# Patient Record
Sex: Female | Born: 1935 | Race: White | Hispanic: No | Smoking: Never smoker
Health system: Southern US, Community
[De-identification: ages and names within clinical notes are randomized; demographics above are authoritative.]

## PROBLEM LIST (undated history)

## (undated) DIAGNOSIS — I739 Peripheral vascular disease, unspecified: Secondary | ICD-10-CM

## (undated) DIAGNOSIS — F329 Major depressive disorder, single episode, unspecified: Secondary | ICD-10-CM

## (undated) DIAGNOSIS — K219 Gastro-esophageal reflux disease without esophagitis: Secondary | ICD-10-CM

## (undated) DIAGNOSIS — E119 Type 2 diabetes mellitus without complications: Secondary | ICD-10-CM

## (undated) DIAGNOSIS — M1990 Unspecified osteoarthritis, unspecified site: Secondary | ICD-10-CM

## (undated) DIAGNOSIS — N289 Disorder of kidney and ureter, unspecified: Secondary | ICD-10-CM

## (undated) DIAGNOSIS — G20A1 Parkinson's disease without dyskinesia, without mention of fluctuations: Secondary | ICD-10-CM

## (undated) DIAGNOSIS — I890 Lymphedema, not elsewhere classified: Secondary | ICD-10-CM

## (undated) DIAGNOSIS — N189 Chronic kidney disease, unspecified: Secondary | ICD-10-CM

## (undated) DIAGNOSIS — G20 Parkinson's disease: Secondary | ICD-10-CM

## (undated) DIAGNOSIS — R0600 Dyspnea, unspecified: Secondary | ICD-10-CM

## (undated) DIAGNOSIS — F32A Depression, unspecified: Secondary | ICD-10-CM

## (undated) HISTORY — DX: Depression, unspecified: F32.A

## (undated) HISTORY — PX: DIALYSIS FISTULA CREATION: SHX611

## (undated) HISTORY — DX: Major depressive disorder, single episode, unspecified: F32.9

## (undated) HISTORY — PX: CHOLECYSTECTOMY: SHX55

---

## 2003-10-19 ENCOUNTER — Ambulatory Visit: Payer: Self-pay | Admitting: Gastroenterology

## 2003-12-20 ENCOUNTER — Ambulatory Visit: Payer: Self-pay | Admitting: Family Medicine

## 2004-07-05 ENCOUNTER — Ambulatory Visit: Payer: Self-pay | Admitting: Family Medicine

## 2004-07-12 ENCOUNTER — Ambulatory Visit: Payer: Self-pay | Admitting: Internal Medicine

## 2004-07-14 ENCOUNTER — Ambulatory Visit: Payer: Self-pay | Admitting: Internal Medicine

## 2004-07-31 ENCOUNTER — Ambulatory Visit: Payer: Self-pay | Admitting: Internal Medicine

## 2004-08-14 ENCOUNTER — Ambulatory Visit: Payer: Self-pay | Admitting: Internal Medicine

## 2004-08-18 ENCOUNTER — Ambulatory Visit: Payer: Self-pay | Admitting: Internal Medicine

## 2004-08-21 ENCOUNTER — Ambulatory Visit (HOSPITAL_COMMUNITY): Admission: RE | Admit: 2004-08-21 | Discharge: 2004-08-21 | Payer: Self-pay | Admitting: Internal Medicine

## 2004-09-15 ENCOUNTER — Ambulatory Visit: Payer: Self-pay | Admitting: Internal Medicine

## 2004-10-15 ENCOUNTER — Ambulatory Visit: Payer: Self-pay | Admitting: Internal Medicine

## 2004-11-15 ENCOUNTER — Ambulatory Visit: Payer: Self-pay | Admitting: Internal Medicine

## 2004-12-15 ENCOUNTER — Ambulatory Visit: Payer: Self-pay | Admitting: Internal Medicine

## 2005-01-15 ENCOUNTER — Ambulatory Visit: Payer: Self-pay | Admitting: Internal Medicine

## 2005-02-15 ENCOUNTER — Ambulatory Visit: Payer: Self-pay | Admitting: Internal Medicine

## 2005-04-18 ENCOUNTER — Ambulatory Visit: Payer: Self-pay | Admitting: Family Medicine

## 2005-04-18 ENCOUNTER — Ambulatory Visit: Payer: Self-pay | Admitting: Internal Medicine

## 2005-05-15 ENCOUNTER — Ambulatory Visit: Payer: Self-pay | Admitting: Internal Medicine

## 2005-06-15 ENCOUNTER — Ambulatory Visit: Payer: Self-pay | Admitting: Internal Medicine

## 2005-07-15 ENCOUNTER — Ambulatory Visit: Payer: Self-pay | Admitting: Internal Medicine

## 2005-08-15 ENCOUNTER — Ambulatory Visit: Payer: Self-pay | Admitting: Internal Medicine

## 2005-09-15 ENCOUNTER — Ambulatory Visit: Payer: Self-pay | Admitting: Internal Medicine

## 2005-10-15 ENCOUNTER — Ambulatory Visit: Payer: Self-pay | Admitting: Internal Medicine

## 2005-11-15 ENCOUNTER — Ambulatory Visit: Payer: Self-pay | Admitting: Internal Medicine

## 2005-12-15 ENCOUNTER — Ambulatory Visit: Payer: Self-pay | Admitting: Internal Medicine

## 2005-12-19 ENCOUNTER — Ambulatory Visit: Payer: Self-pay | Admitting: Surgery

## 2005-12-19 ENCOUNTER — Other Ambulatory Visit: Payer: Self-pay

## 2006-01-01 ENCOUNTER — Ambulatory Visit: Payer: Self-pay | Admitting: Internal Medicine

## 2006-01-15 ENCOUNTER — Ambulatory Visit: Payer: Self-pay | Admitting: Internal Medicine

## 2006-02-15 ENCOUNTER — Ambulatory Visit: Payer: Self-pay | Admitting: Internal Medicine

## 2006-03-18 ENCOUNTER — Ambulatory Visit: Payer: Self-pay | Admitting: Internal Medicine

## 2006-04-16 ENCOUNTER — Ambulatory Visit: Payer: Self-pay | Admitting: Internal Medicine

## 2006-05-10 ENCOUNTER — Ambulatory Visit: Payer: Self-pay | Admitting: Family Medicine

## 2006-05-16 ENCOUNTER — Ambulatory Visit: Payer: Self-pay | Admitting: Internal Medicine

## 2006-06-16 ENCOUNTER — Ambulatory Visit: Payer: Self-pay | Admitting: Internal Medicine

## 2006-07-16 ENCOUNTER — Ambulatory Visit: Payer: Self-pay | Admitting: Internal Medicine

## 2006-08-16 ENCOUNTER — Ambulatory Visit: Payer: Self-pay | Admitting: Internal Medicine

## 2006-09-16 ENCOUNTER — Ambulatory Visit: Payer: Self-pay | Admitting: Internal Medicine

## 2006-10-16 ENCOUNTER — Ambulatory Visit: Payer: Self-pay | Admitting: Internal Medicine

## 2006-11-16 ENCOUNTER — Ambulatory Visit: Payer: Self-pay | Admitting: Internal Medicine

## 2006-12-05 ENCOUNTER — Ambulatory Visit: Payer: Self-pay | Admitting: Otolaryngology

## 2006-12-16 ENCOUNTER — Ambulatory Visit: Payer: Self-pay | Admitting: Internal Medicine

## 2006-12-19 ENCOUNTER — Ambulatory Visit: Payer: Self-pay | Admitting: Otolaryngology

## 2007-01-16 ENCOUNTER — Ambulatory Visit: Payer: Self-pay | Admitting: Internal Medicine

## 2007-02-16 ENCOUNTER — Ambulatory Visit: Payer: Self-pay | Admitting: Internal Medicine

## 2007-03-16 ENCOUNTER — Ambulatory Visit: Payer: Self-pay | Admitting: Internal Medicine

## 2007-04-07 ENCOUNTER — Ambulatory Visit: Payer: Self-pay | Admitting: Otolaryngology

## 2007-04-09 ENCOUNTER — Ambulatory Visit: Payer: Self-pay

## 2007-04-16 ENCOUNTER — Ambulatory Visit: Payer: Self-pay | Admitting: Internal Medicine

## 2007-04-17 ENCOUNTER — Ambulatory Visit: Payer: Self-pay | Admitting: Internal Medicine

## 2007-05-12 ENCOUNTER — Ambulatory Visit: Payer: Self-pay | Admitting: Family Medicine

## 2007-05-16 ENCOUNTER — Ambulatory Visit: Payer: Self-pay | Admitting: Internal Medicine

## 2007-06-16 ENCOUNTER — Ambulatory Visit: Payer: Self-pay | Admitting: Internal Medicine

## 2007-07-16 ENCOUNTER — Ambulatory Visit: Payer: Self-pay | Admitting: Internal Medicine

## 2007-08-16 ENCOUNTER — Ambulatory Visit: Payer: Self-pay | Admitting: Internal Medicine

## 2007-09-16 ENCOUNTER — Ambulatory Visit: Payer: Self-pay | Admitting: Internal Medicine

## 2007-10-16 ENCOUNTER — Ambulatory Visit: Payer: Self-pay | Admitting: Internal Medicine

## 2007-11-16 ENCOUNTER — Ambulatory Visit: Payer: Self-pay | Admitting: Internal Medicine

## 2008-05-07 ENCOUNTER — Ambulatory Visit: Payer: Self-pay | Admitting: Family Medicine

## 2008-05-17 ENCOUNTER — Ambulatory Visit: Payer: Self-pay | Admitting: Vascular Surgery

## 2008-06-14 ENCOUNTER — Ambulatory Visit: Payer: Self-pay | Admitting: Family Medicine

## 2009-08-01 ENCOUNTER — Ambulatory Visit: Payer: Self-pay | Admitting: Family Medicine

## 2010-02-15 ENCOUNTER — Ambulatory Visit: Payer: Self-pay | Admitting: Pain Medicine

## 2010-03-01 ENCOUNTER — Ambulatory Visit: Payer: Self-pay | Admitting: Pain Medicine

## 2010-03-13 ENCOUNTER — Ambulatory Visit: Payer: Self-pay | Admitting: Pain Medicine

## 2010-04-05 ENCOUNTER — Ambulatory Visit: Payer: Self-pay | Admitting: Pain Medicine

## 2010-06-07 ENCOUNTER — Encounter
Payer: Medicare Other | Attending: Physical Medicine and Rehabilitation | Admitting: Physical Medicine and Rehabilitation

## 2010-06-07 DIAGNOSIS — N19 Unspecified kidney failure: Secondary | ICD-10-CM | POA: Insufficient documentation

## 2010-06-07 DIAGNOSIS — M545 Low back pain, unspecified: Secondary | ICD-10-CM | POA: Insufficient documentation

## 2010-06-07 DIAGNOSIS — R29898 Other symptoms and signs involving the musculoskeletal system: Secondary | ICD-10-CM | POA: Insufficient documentation

## 2010-06-07 DIAGNOSIS — Z79899 Other long term (current) drug therapy: Secondary | ICD-10-CM | POA: Insufficient documentation

## 2010-06-07 DIAGNOSIS — M79609 Pain in unspecified limb: Secondary | ICD-10-CM

## 2010-06-07 DIAGNOSIS — M48061 Spinal stenosis, lumbar region without neurogenic claudication: Secondary | ICD-10-CM

## 2010-06-07 DIAGNOSIS — Z992 Dependence on renal dialysis: Secondary | ICD-10-CM | POA: Insufficient documentation

## 2010-06-07 DIAGNOSIS — E119 Type 2 diabetes mellitus without complications: Secondary | ICD-10-CM | POA: Insufficient documentation

## 2010-06-07 DIAGNOSIS — M47817 Spondylosis without myelopathy or radiculopathy, lumbosacral region: Secondary | ICD-10-CM

## 2010-06-07 DIAGNOSIS — R279 Unspecified lack of coordination: Secondary | ICD-10-CM | POA: Insufficient documentation

## 2010-06-07 DIAGNOSIS — M129 Arthropathy, unspecified: Secondary | ICD-10-CM | POA: Insufficient documentation

## 2010-06-08 NOTE — Group Therapy Note (Signed)
Renee Daugherty is referred by Dr. Austin Miles.  Renee Daugherty is an 77 year old widowed woman who is accompanied by her daughter, [name].  She was [facility] to [facility] for chronic low back pain.  Renee Daugherty' past medical history is significant for 30- year history of diabetes, currently on dialysis 3 times a week with the fistula in her left upper extremity.  She also has a history of neuropathy in her legs and feet.  Her chief complaint is low back pain, which is worse when she is up walking or standing.  She also gets a weak feeling in her lower extremities when she is up for any length of time more than 3 minutes.  Her average pain is about 8 on a 10.  Her sleep is relatively good.  Her pain interferes moderately with activity levels.  She does not drive.  She is independent with self-care.  She lives independently.  She does not use a cane or walker at this time.  She does not use any kind of assistive device.  Denies problems controlling bowel or bladder.  Denies depression, anxiety, or suicidal ideation.  She does admit to occasional nausea.  PAST MEDICAL HISTORY:  Significant for diabetes x30 years, renal failure, neuropathy, and chronic low back pain.  SOCIAL HISTORY:  The patient is widowed.  Lives alone.  Denies smoking or alcohol use.  FAMILY HISTORY:  Positive for diabetes.  Her medications she brings in today includes the following:  Gabapentin 300 mg once a day, glipizide, Nexium, pravastatin, Actos tab, vitamin D, Renvela tablet, Caltrate, __________ zolpidem and lorazepam.  Most of these medications are prescribed by Dr. Jerl Mina.  PHYSICAL EXAMINATION:  VITAL SIGNS:  Blood pressure is 112/36, pulse 90, respirations 18, 99% saturated on room air. GENERAL:  She is an obese woman who does not appear in any distress. She is oriented x3. NEUROLOGIC:  Her speech is clear.  Her affect is bright.  She is alert, cooperative, and  pleasant.  Follows commands without difficulty. Answers my questions appropriately.  Cranial nerves:  Coordinations are intact.  Her reflexes are 1 to 2+ in the upper extremities, diminished in the lower extremities.  No abnormal tone, clonus, or tremors are noted.  Motor strength is good in the upper extremities.  She has decreased strength at bilateral hip flexors and hip extensors for 4+/5 distally.  Her lower extremities have relatively good strength.  She transitions from sitting to standing slowly.  Her gait is slow and careful.  She has difficulty with tandem gait.  She has some difficulty with Romberg test.  She reports little discomfort with forward flexion of the lumbar spine, but is aggravated more with extension of the lumbar spine.  Overall, her sensation is rather good in both upper as well as lower extremities.  She has an MRI, which she brings in which was done at [facility], which was done on March 01, 2010, dictated by Dr. Elige Ko.  Report shows mild degenerative changes T12 through L3-4. She has moderate broad-based disk bulge with the right far lateral disk component abutting the right extraforaminal L3 root, moderate bilateral facet arthropathy, and moderate spinal stenosis.  Moderate bilateral foraminal stenosis, L4-5 broad-based disk bulge bilateral far lateral disk components in close proximity to the extraforaminal L4 nerve root, moderate bilateral facet arthropathy with ligamentum flavum infolding resulting in mild-to-moderate spinal stenosis, mild bilateral foraminal stenosis at L5-S1, mild broad-based disk bulge, severe bilateral  facet arthropathy, moderate left foraminal stenosis.  No significant right foraminal stenosis, no central canal stenosis.  IMPRESSION: 1. Lumbago likely related to multilevel facet arthropathy and lumbar     stenosis, predominantly L3-4, L4-5, L5-S1. 2. Mild bilateral lower extremity weakness more  proximally. 3. Mild balance disorder.  PLAN:  I have discussed a variety of treatment options with Renee Daugherty and her daughter today, which included consideration of use of a TENS unit as well as modalities or icy hot patch or topical counter irritant type patches.  We have also discussed use of various pain medications, which do have some risk of sedation and increased gait instability.  I have also discussed consideration of a TENS unit as well as some physical therapy to address balance and lower extremity strength.  They would like to start out trialing topical agents, may consider TENS unit as well.  They also seem to be interested in pursuing some physical therapy; however, transportation needs to be sorted out prior to ordering this since Ms. Gabbert does not drive.  I have answered all of her questions.  They seem comfortable with our treatment plan.  I will see her back in a month.     Brantley Stage, M.D.    DMK/MedQ D:  06/07/2010 14:10:33  T:  06/08/2010 02:38:30  Job #:  [id number]  cc:   Dr. Mike Craze, MD Fax: [phone]  Molli Barrows, MD

## 2010-07-24 ENCOUNTER — Ambulatory Visit: Payer: Medicare Other | Admitting: Physical Medicine and Rehabilitation

## 2010-09-02 ENCOUNTER — Emergency Department: Payer: Self-pay | Admitting: Emergency Medicine

## 2010-09-07 ENCOUNTER — Inpatient Hospital Stay: Payer: Self-pay | Admitting: Internal Medicine

## 2011-03-20 ENCOUNTER — Emergency Department: Payer: Self-pay | Admitting: Emergency Medicine

## 2011-04-24 ENCOUNTER — Emergency Department: Payer: Self-pay | Admitting: Emergency Medicine

## 2011-07-23 ENCOUNTER — Inpatient Hospital Stay: Payer: Self-pay | Admitting: Internal Medicine

## 2011-07-23 LAB — CBC
MCH: 33.2 pg (ref 26.0–34.0)
MCHC: 32.7 g/dL (ref 32.0–36.0)
MCV: 101 fL — ABNORMAL HIGH (ref 80–100)
Platelet: 262 10*3/uL (ref 150–440)
RBC: 3.45 10*6/uL — ABNORMAL LOW (ref 3.80–5.20)

## 2011-07-23 LAB — COMPREHENSIVE METABOLIC PANEL
Albumin: 3.8 g/dL (ref 3.4–5.0)
Anion Gap: 11 (ref 7–16)
Bilirubin,Total: 0.3 mg/dL (ref 0.2–1.0)
Calcium, Total: 9.7 mg/dL (ref 8.5–10.1)
Co2: 31 mmol/L (ref 21–32)
EGFR (African American): 5 — ABNORMAL LOW
EGFR (Non-African Amer.): 4 — ABNORMAL LOW
Osmolality: 299 (ref 275–301)

## 2011-07-23 LAB — LIPASE, BLOOD: Lipase: 379 U/L (ref 73–393)

## 2011-07-24 LAB — HEMOGLOBIN
HGB: 10.5 g/dL — ABNORMAL LOW (ref 12.0–16.0)
HGB: 10.4 g/dL — ABNORMAL LOW (ref 12.0–16.0)

## 2011-07-24 LAB — PHOSPHORUS: Phosphorus: 5.1 mg/dL — ABNORMAL HIGH (ref 2.5–4.9)

## 2011-07-26 LAB — BASIC METABOLIC PANEL
Anion Gap: 12 (ref 7–16)
Calcium, Total: 9.1 mg/dL (ref 8.5–10.1)
Co2: 30 mmol/L (ref 21–32)
EGFR (African American): 6 — ABNORMAL LOW
Osmolality: 286 (ref 275–301)

## 2011-07-26 LAB — PHOSPHORUS: Phosphorus: 4.9 mg/dL (ref 2.5–4.9)

## 2011-08-07 ENCOUNTER — Other Ambulatory Visit: Payer: Self-pay | Admitting: Gastroenterology

## 2011-08-07 LAB — CBC WITH DIFFERENTIAL/PLATELET
Basophil %: 1 %
Eosinophil #: 0.1 10*3/uL (ref 0.0–0.7)
HGB: 11.7 g/dL — ABNORMAL LOW (ref 12.0–16.0)
MCH: 32.3 pg (ref 26.0–34.0)
MCHC: 32.3 g/dL (ref 32.0–36.0)
Neutrophil #: 5.5 10*3/uL (ref 1.4–6.5)
Neutrophil %: 68.9 %

## 2011-11-14 ENCOUNTER — Emergency Department: Payer: Self-pay

## 2011-11-14 LAB — LIPASE, BLOOD: Lipase: 541 U/L — ABNORMAL HIGH (ref 73–393)

## 2011-11-14 LAB — CBC
HCT: 31.7 % — ABNORMAL LOW (ref 35.0–47.0)
MCHC: 34.2 g/dL (ref 32.0–36.0)
Platelet: 285 10*3/uL (ref 150–440)
RDW: 14.7 % — ABNORMAL HIGH (ref 11.5–14.5)

## 2011-11-14 LAB — COMPREHENSIVE METABOLIC PANEL
Albumin: 3.5 g/dL (ref 3.4–5.0)
Alkaline Phosphatase: 81 U/L (ref 50–136)
Calcium, Total: 8.8 mg/dL (ref 8.5–10.1)
Glucose: 231 mg/dL — ABNORMAL HIGH (ref 65–99)
SGOT(AST): 55 U/L — ABNORMAL HIGH (ref 15–37)
Sodium: 139 mmol/L (ref 136–145)

## 2011-11-14 LAB — CK TOTAL AND CKMB (NOT AT ARMC): CK-MB: 0.7 ng/mL (ref 0.5–3.6)

## 2011-11-14 LAB — TROPONIN I: Troponin-I: <0.02 ng/mL

## 2011-12-31 ENCOUNTER — Ambulatory Visit: Payer: Self-pay | Admitting: Surgery

## 2011-12-31 LAB — COMPREHENSIVE METABOLIC PANEL
Anion Gap: 7 (ref 7–16)
Bilirubin,Total: 0.5 mg/dL (ref 0.2–1.0)
Calcium, Total: 9.2 mg/dL (ref 8.5–10.1)
Co2: 29 mmol/L (ref 21–32)
Creatinine: 6.55 mg/dL — ABNORMAL HIGH (ref 0.60–1.30)
EGFR (Non-African Amer.): 5 — ABNORMAL LOW
Osmolality: 285 (ref 275–301)
Potassium: 3.7 mmol/L (ref 3.5–5.1)
Sodium: 133 mmol/L — ABNORMAL LOW (ref 136–145)

## 2012-01-04 ENCOUNTER — Inpatient Hospital Stay: Payer: Self-pay | Admitting: Surgery

## 2012-01-04 LAB — POTASSIUM: Potassium: 4.2 mmol/L (ref 3.5–5.1)

## 2012-01-05 LAB — RENAL FUNCTION PANEL
Anion Gap: 12 (ref 7–16)
Calcium, Total: 8.8 mg/dL (ref 8.5–10.1)
Co2: 25 mmol/L (ref 21–32)
Glucose: 193 mg/dL — ABNORMAL HIGH (ref 65–99)
Osmolality: 281 (ref 275–301)
Phosphorus: 5.7 mg/dL — ABNORMAL HIGH (ref 2.5–4.9)
Sodium: 136 mmol/L (ref 136–145)

## 2012-01-05 LAB — CBC WITH DIFFERENTIAL/PLATELET
Basophil #: 0 10*3/uL (ref 0.0–0.1)
Basophil %: 0.1 %
HCT: 32 % — ABNORMAL LOW (ref 35.0–47.0)
HGB: 10.4 g/dL — ABNORMAL LOW (ref 12.0–16.0)
Lymphocyte %: 3.4 %
MCV: 99 fL (ref 80–100)
Monocyte %: 5.4 %
Neutrophil #: 15.7 10*3/uL — ABNORMAL HIGH (ref 1.4–6.5)
Neutrophil %: 91.1 %
RBC: 3.25 10*6/uL — ABNORMAL LOW (ref 3.80–5.20)
RDW: 15 % — ABNORMAL HIGH (ref 11.5–14.5)
WBC: 17.2 10*3/uL — ABNORMAL HIGH (ref 3.6–11.0)

## 2012-01-05 LAB — POTASSIUM: Potassium: 3.5 mmol/L (ref 3.5–5.1)

## 2012-01-05 LAB — HEPATIC FUNCTION PANEL A (ARMC)
Albumin: 3 g/dL — ABNORMAL LOW (ref 3.4–5.0)
Bilirubin, Direct: 1.9 mg/dL — ABNORMAL HIGH (ref 0.00–0.20)
SGPT (ALT): 75 U/L (ref 12–78)

## 2012-01-06 LAB — HEPATIC FUNCTION PANEL A (ARMC)
Alkaline Phosphatase: 128 U/L (ref 50–136)
Bilirubin,Total: 2.2 mg/dL — ABNORMAL HIGH (ref 0.2–1.0)

## 2012-01-07 LAB — RENAL FUNCTION PANEL
Anion Gap: 9 (ref 7–16)
Calcium, Total: 8.8 mg/dL (ref 8.5–10.1)
Co2: 28 mmol/L (ref 21–32)
Glucose: 164 mg/dL — ABNORMAL HIGH (ref 65–99)
Osmolality: 277 (ref 275–301)
Phosphorus: 2.7 mg/dL (ref 2.5–4.9)
Sodium: 133 mmol/L — ABNORMAL LOW (ref 136–145)

## 2012-01-07 LAB — HEPATIC FUNCTION PANEL A (ARMC)
Alkaline Phosphatase: 136 U/L (ref 50–136)
Bilirubin, Direct: 1.5 mg/dL — ABNORMAL HIGH (ref 0.00–0.20)
SGPT (ALT): 50 U/L (ref 12–78)

## 2012-01-07 LAB — CBC WITH DIFFERENTIAL/PLATELET
Basophil %: 0.5 %
Eosinophil %: 2 %
HCT: 31.5 % — ABNORMAL LOW (ref 35.0–47.0)
HGB: 10.5 g/dL — ABNORMAL LOW (ref 12.0–16.0)
Lymphocyte %: 10.5 %
MCHC: 33.2 g/dL (ref 32.0–36.0)

## 2012-01-07 LAB — LIPASE, BLOOD: Lipase: 218 U/L (ref 73–393)

## 2012-01-08 LAB — PATHOLOGY REPORT

## 2012-01-15 ENCOUNTER — Ambulatory Visit: Payer: Self-pay | Admitting: Gastroenterology

## 2012-02-06 ENCOUNTER — Ambulatory Visit: Payer: Self-pay | Admitting: Gastroenterology

## 2012-03-05 ENCOUNTER — Ambulatory Visit: Payer: Self-pay | Admitting: Family Medicine

## 2012-03-19 ENCOUNTER — Ambulatory Visit: Payer: Self-pay | Admitting: Gastroenterology

## 2012-12-19 ENCOUNTER — Ambulatory Visit: Payer: Self-pay | Admitting: Gastroenterology

## 2013-04-20 ENCOUNTER — Ambulatory Visit: Payer: Self-pay | Admitting: Family Medicine

## 2014-03-30 ENCOUNTER — Ambulatory Visit: Payer: Self-pay | Admitting: Vascular Surgery

## 2014-04-07 ENCOUNTER — Ambulatory Visit: Payer: Self-pay | Admitting: Vascular Surgery

## 2014-04-14 ENCOUNTER — Ambulatory Visit
Admit: 2014-04-14 | Disposition: A | Discharge status: Home / Self Care | Payer: Self-pay | Attending: Vascular Surgery | Admitting: Vascular Surgery

## 2014-05-03 ENCOUNTER — Ambulatory Visit
Admit: 2014-05-03 | Disposition: A | Discharge status: Home / Self Care | Payer: Self-pay | Attending: Vascular Surgery | Admitting: Vascular Surgery

## 2014-05-04 NOTE — Consult Note (Signed)
Chief Complaint:   Subjective/Chief Complaint Feels better. No abdominal pain or vomiting. Lipase is normal. LFT's continue to improve.  Recommendations: Full liquid dsiet. Advance as tolerated. OK to discharge from [facility]. Will follow her in 2 weeks. Discussed with Dr. Katrinka BlazingSmith and her daughter. Will sign off. Please call me if needed.   Electronic Signatures: Lurline DelIftikhar, Michaelle Bottomley (MD)  (Signed 23-Dec-13 13:03)  Authored: Chief Complaint   Last Updated: 23-Dec-13 13:03 by Lurline DelIftikhar, Bari Leib (MD)

## 2014-05-04 NOTE — Consult Note (Signed)
Chief Complaint:   Subjective/Chief Complaint Patient developed fever overnight with some hypotension. Currently in dialysis. ? Cholangitis. Will obtain stat LFT's and CBC. Keep NPO. May need ERCP today. Will discuss with anesthesiologist due to her h/o renal failure and being on dialysis.   Electronic Signatures: Lurline DelIftikhar, Kallista Pae (MD)  (Signed 21-Dec-13 10:22)  Authored: Chief Complaint   Last Updated: 21-Dec-13 10:22 by Lurline DelIftikhar, Akiyah Eppolito (MD)

## 2014-05-04 NOTE — Op Note (Signed)
DATE OF BIRTH:  May 31, 1926  DATE OF PROCEDURE:  01/04/2012  PREOPERATIVE DIAGNOSIS:  Chronic cholecystitis, cholelithiasis.   POSTOPERATIVE DIAGNOSIS:  Chronic cholecystitis, cholelithiasis, choledocholithiasis.  PROCEDURE:  Laparoscopic cholecystectomy, cholangiogram.  SURGEON:  Adella Hare, MD   ANESTHESIA:  General.   INDICATIONS:  This 81 year old female with chronic renal failure recently had epigastric pain. She had CT findings of gallstones, and surgery was recommended for definitive treatment.   PROCEDURE IN DETAIL: The patient was placed on the operating table in the supine position. Under general endotracheal anesthesia, the abdomen was prepared with ChloraPrep, draped in a sterile manner. The incision was made in the inferior aspect of the umbilicus and carried down to the deep fascia, which was grasped with a laryngeal hook and elevated. A Veress needle was inserted, aspirated and irrigated with a saline solution. Next, the peritoneal cavity was inflated with carbon dioxide. The Veress needle was removed. The 10-mm cannula was inserted. The 10- mm, 0-degree laparoscope was inserted to view the peritoneal cavity. Another incision was made in the epigastrium just to the right of the midline to introduce an 11-mm cannula. Two incisions were made in the lateral aspect of the right upper quadrant to introduce two 5-mm cannulas.   Initial inspection revealed the liver appeared to have mild blunting of the inferior margin. There were no gross adhesions seen within the peritoneal cavity. The gallbladder was retracted towards the right shoulder. The neck of the gallbladder was retracted inferiorly and laterally. There was a large amount of fatty tissue around the neck of the gallbladder, which was dissected. The cystic duct was dissected free from surrounding structures and appeared to be somewhat large in size, approximately 5 to 6 mm. The cystic artery was dissected free from surrounding  structures. The neck of the gallbladder was mobilized with incision of visceral peritoneum. The porta hepatis was demonstrated. A critical view of safety was demonstrated. An Endo Clip was placed across the cystic duct adjacent to the neck of the gallbladder. An incision was made in the cystic duct to introduce a Reddick catheter. Half strength Conray-60 dye was injected, as a cholangiogram was done with fluoroscopy, demonstrating approximately 4 stones within the common bile duct. There was prompt flow of the dye into the duodenum.   Next, the Reddick catheter was removed. The cystic duct was controlled with an Endo Clip and divided, and subsequently placed an Endoloop around the cystic duct and removed the first clip, and then after tightening down the Endoloop, 2 clips were applied for a secure closure. The cystic artery was controlled with double Endo Clips and divided. The gallbladder was dissected free from the liver with hook and cautery. There was some bleeding during the course of the procedure, which was aspirated. That site was irrigated with heparinized saline solution and further aspirated, and subsequently determined hemostasis was intact. The gallbladder was delivered up through the infraumbilical incision and opened and suctioned, and used the stone forceps to crush and remove another stone. It was necessary to lengthen the skin incision and fascial incision by about 6 mm to allow removal of the gallbladder, which was submitted in formalin for routine pathology. The right upper quadrant was further inspected, aspirated a trace of fluid. Hemostasis was intact. The cannulas were removed, carbon dioxide allowed to escape from the peritoneal cavity. The fascial defect at the umbilicus was closed with a 0 Vicryl figure-of-eight suture. Skin incisions were closed with interrupted 5-0 chromic subcuticular sutures, Benzoin and Steri-Strips. Dressings  were applied with paper tape.. The patient tolerated  surgery satisfactorily and was then moved to the recovery room for postoperative care.  ____________________________ J. Renda RollsWilton Smith, MD jws:ms D: 01/04/2012 15:12:43 ET T: 01/06/2012 15:29:50 ET JOB#: [id number]  cc: Adella HareJ. Wilton Smith, MD, <Dictator> Adella HareWILTON J SMITH MD ELECTRONICALLY SIGNED 01/07/2012 19:36

## 2014-05-04 NOTE — Consult Note (Signed)
Chief Complaint:   Subjective/Chief Complaint Complaining of mild, generalized abdominal pain. Tolerating clear liquids and passing flatus.   VITAL SIGNS/ANCILLARY NOTES: **Vital Signs.:   22-Dec-13 13:12   Temperature Temperature (F) 98.1   Celsius 36.7   Temperature Source Oral   Pulse Pulse 102   Respirations Respirations 18   Systolic BP Systolic BP 96   Diastolic BP (mmHg) Diastolic BP (mmHg) 47   Mean BP 63   Pulse Ox % Pulse Ox % 91   Pulse Ox Activity Level  At rest   Oxygen Delivery Room Air/ 21 %   Brief Assessment:   Additional Physical Exam Abdomen is somewhat distended. Mild diffuse tenderness without rebound. Bowel sounds are positive.   Lab Results: Hepatic:  22-Dec-13 03:50    Bilirubin, Total  2.2   Bilirubin, Direct  1.7 (Result(s) reported on 06 Jan 2012 at 04:18AM.)   Alkaline Phosphatase 128   SGPT (ALT) 63   SGOT (AST)  101   Total Protein, Serum  6.0   Albumin, Serum  2.8  Routine Chem:  22-Dec-13 10:27    Result Comment LIPASE - This specimen was collected through an   - indwelling catheter or arterial line.  - A minimum of 5mls of blood was wasted prior    - to collecting the sample.  Interpret  - results with caution.  Result(s) reported on 06 Jan 2012 at 11:09AM.   Lipase  964   Assessment/Plan:  Assessment/Plan:   Assessment Choledocholithiasis with cholangitis. S/P stent placement and couple of doses of antibiotic. Now afebrile. LFT's are trending down.  Abdominal pain may be secondary to recent surgery, vs mild post ERCP pancreatitis as suggested by elevated lipase although lipase elevation after ERCP is common even in the abscence of pancreatitis especially in patients with renal failure.    Plan Continue clear liquids. Repeat lipase and LFT's in am.   Electronic Signatures: Lurline DelIftikhar, Azaleah Usman (MD)  (Signed 22-Dec-13 14:58)  Authored: Chief Complaint, VITAL SIGNS/ANCILLARY NOTES, Brief Assessment, Lab Results,  Assessment/Plan   Last Updated: 22-Dec-13 14:58 by Lurline DelIftikhar, Stepan Verrette (MD)

## 2014-05-04 NOTE — Consult Note (Signed)
PATIENT NAME:  Renee Daugherty, Renee Daugherty MR#:  [id number] DATE OF BIRTH:  January 25, 1926  DATE OF CONSULTATION:  01/06/2012  CONSULTING PHYSICIAN:  Renee DelShaukat Loudon Krakow, MD  REFERRING PHYSICIAN:  Adella HareJ. Wilton Smith, MD  REASON FOR CONSULTATION:  Common bile duct stones.   HISTORY OF PRESENT ILLNESS:  An 81 year old female with history of diabetes, chronic renal failure, on hemodialysis.  The patient underwent an elective cholecystectomy yesterday by Dr. Katrinka BlazingSmith.  Preop liver enzymes were normal.  An intraoperative cholangiogram was performed, which showed multiple stones in the common bile duct.  The patient was admitted by Dr. Renda RollsWilton Daugherty after surgery and evaluated by me yesterday.  She had just come out of the OR, was complaining of some soreness in the upper abdomen near the incision site.  No nausea, vomiting, fever, chills or other symptoms were reported by the patient.  The need for an ERCP was discussed with the patient and family members.  The patient was due to have a dialysis today and it was decided to perform the ERCP after dialysis, preferably on Sunday, due to the risks of electrolyte imbalance shortly after dialysis.  Apparently overnight, the patient developed fever of up to 102.  She became somewhat hypotensive.  The patient was seen by Dr. Lemar LivingsByrnett and apparently was given a dose of Rocephin.  She feels better today.  Denies any significant symptoms, except for mild abdominal soreness.  Her blood pressure is better and she does not appear to be toxic or septic.  Her liver enzymes have gone up from being normal, raising concerns about possible cholangitis and biliary obstruction secondary to stones noted yesterday.   PAST MEDICAL HISTORY:  Significant for history of diabetes and chronic renal failure.  The patient has been on hemodialysis.   ALLERGIES:  ADHESIVE AND LEVAQUIN.   SOCIAL HISTORY AND FAMILY HISTORY:  Grossly unremarkable.   REVIEW OF SYSTEMS:  Negative except for what is mentioned in the  History of Present Illness.   PHYSICAL EXAMINATION: GENERAL:  Fairly well-built female, does not appear to be toxic or septic, clinically does not appear to be jaundiced.  VITAL SIGNS:  Temperature is down to 98.6.  She is tachycardic since admission, around 110 to 114, respirations 18 to 20, blood pressure 117/69, although it was as low as 80/50 last night.  LUNGS:  Grossly clear to auscultation bilaterally.  CARDIOVASCULAR:  Regular rate and rhythm.  No gallops or murmur.  ABDOMEN:  Scars from recent laparoscopic surgery.  Bowel sounds are positive but sluggish.  No significant abdominal tenderness or rebound was noted.  NEUROLOGICAL:  Appears to be unremarkable.   LABORATORY DATA:  The patient's liver enzymes were normal a few days ago.  Creatinine is 6.5 as of today.  Total bilirubin is 2.6, direct bilirubin is 1.9, alkaline phosphatase normal at 130, ALT normal at 75, AST is elevated at 128.  Serum potassium is 3.5 that is after dialysis.  White cell count has gone up to 17,000, hemoglobin 10.4, hematocrit 32 and platelet count of 199.   ASSESSMENT AND PLAN:  The patient is with recent cholecystectomy.  Intraoperative cholangiogram showed multiple large common bile duct stones.  The patient had no signs of biliary obstruction, jaundice or cholangitis, although she has developed fever overnight and her liver enzymes have gone up as well as white cell count, suggesting probable cholangitis secondary to biliary obstruction.  Due to this change in her clinical status, it was decided to proceed with an endoscopic retrograde cholangiopancreatography immediately.  The case was discussed with the anesthesiologist this morning and it was decided to proceed with an endoscopic retrograde cholangiopancreatography shortly after the dialysis is complete.  The patient is in the endoscopy unit and will proceed with an endoscopic retrograde cholangiopancreatography as soon as possible.  The patient has received a  dose of antibiotic and she is now afebrile.  The procedure of endoscopic retrograde cholangiopancreatography along with its complications including but not limited to bleeding, perforation and especially post endoscopic retrograde cholangiopancreatography pancreatitis which can be fatal in some cases were discussed with the patient and her multiple family members and they are in full agreement.  Further recommendations to follow.   Thank you so much Dr. Katrinka Blazing.      ____________________________ Renee Del, MD si:eb D: 01/05/2012 14:36:05 ET T: 01/06/2012 18:46:45 ET JOB#: [id number]  cc: Renee Del, MD, <Dictator> J. Renda Rolls, MD Renee Del MD ELECTRONICALLY SIGNED 01/07/2012 18:12

## 2014-05-04 NOTE — Consult Note (Signed)
Brief Consult Note: Diagnosis: Choledocholithiasis.   Patient was seen by consultant.   Discussed with Attending MD.   Comments: Patient underwent cholecystectomy today. IOC showed multiple CBD stones. No signs of biliary obstruction on cholangiogram and her LFT's are normal. Renal failure on dialysis.  Recommendations; Repeat LFT's in am. Dialysis tomorrow. Will plan on perfroming ERCP within next couple of days depending on his clinical course. Discussed with family. Will follow.  Electronic Signatures: Lurline DelIftikhar, Aleeah Greeno (MD)  (Signed 20-Dec-13 17:27)  Authored: Brief Consult Note   Last Updated: 20-Dec-13 17:27 by Lurline DelIftikhar, Naveen Lorusso (MD)

## 2014-05-04 NOTE — Consult Note (Signed)
Chief Complaint:   Subjective/Chief Complaint ERCP done. Multiple huge stones in CBD. An adequate sphincterotomy was not possible due to small ampulla and some edema. A small sphincterotomy was performed and a 10 FR by 7 cm stent was placed. A copious amount of dark green to black bile was noted flowing through the stent. Patient tolerated the procedure well and no immediate complications were noted.   Electronic Signatures: Lurline DelIftikhar, Juleah Paradise (MD)  (Signed 21-Dec-13 18:20)  Authored: Chief Complaint   Last Updated: 21-Dec-13 18:20 by Lurline DelIftikhar, Isamar Nazir (MD)

## 2014-05-07 NOTE — Discharge Summary (Signed)
PATIENT NAME:  Renee PlumberSOMERS, Nuvia B MR#:  [id number] DATE OF BIRTH:  01/25/1926  DATE OF ADMISSION:  01/04/2012 DATE OF DISCHARGE:  01/08/2012  HISTORY OF PRESENT ILLNESS: This 81 year old female had a complaint of a recent severe episode of epigastric pain. She had CT scan which demonstrated gallstones.   Other history includes: 1. Adult onset diabetes mellitus. 2. Peripheral neuropathy.  3. Chronic renal failure and chronic hemodialysis.  4. Hypercholesterolemia.   Details of her history and physical were recorded on the typed H and P.   HOSPITAL COURSE: She came in through the outpatient surgery department for a laparoscopic cholecystectomy and at the time of surgery, intraoperative cholangiogram demonstrated multiple common duct stones and therefore she was admitted to the hospital for further care. Dr. Niel HummerIftikhar was asked to see her for gastroenterology evaluation. He subsequently saw her for consult and performed ERCP on December 21st. Did find multiple large gallstones. Could do only a limited sphincterotomy due to the anatomy and did insert a stent. She did have some epigastric pain which by the 23rd was improved. Did continue with her intermittent hemodialysis while in the hospital.  Her gallbladder pathology was that of chronic cholecystitis, cholelithiasis.   FINAL DIAGNOSES: Chronic cholecystitis, cholelithiasis and choledocholithiasis,   OPERATION:  1. Laparoscopic cholecystectomy and cholangiogram.  2. Endoscopic retrograde cholangiopancreatography with insertion of a stent.   PLAN: For her to follow up with Dr. Niel HummerIftikhar in the office and at a later date. He will plan to repeat the ERCP, remove the stent and make another attempt at sphincterotomy to remove the common duct stones. She will also follow up in my office as well, probably after she has been further evaluated by Dr. Niel HummerIftikhar.     ____________________________ Shela CommonsJ. Renda RollsWilton Smith, MD jws:es D: 01/23/2012 20:55:33  ET T: 01/24/2012 08:18:46 ET JOB#: [id number]  cc: Adella HareJ. Wilton Smith, MD, <Dictator> Adella HareWILTON J SMITH MD ELECTRONICALLY SIGNED 01/25/2012 18:58

## 2014-05-09 NOTE — Consult Note (Signed)
PATIENT NAME:  Renee Daugherty, [name] MR#:  [id number] DATE OF BIRTH:  1926-08-13  DATE OF CONSULTATION:  07/26/2011  REFERRING PHYSICIAN:   CONSULTING PHYSICIAN:  Lurline DelShaukat Geet Hosking, MD  REASON FOR CONSULTATION: Hematochezia.   HISTORY OF PRESENT ILLNESS: This is an 81 year old female with history endstage renal disease on hemodialysis, hypertension, and diabetes The patient was admitted on 07/23/2011 with a two-day history of passing bright red blood per rectum. The patient had a couple of large bloody bowel movements at home. She came to the Emergency Room. Rectal examination showed frank blood in the rectum. She denies any prior such episode, and denies ever having a colonoscopy. She denies any significant abdominal pain. No nausea, vomiting, hematemesis, or melena was reported by the patient.   PAST MEDICAL HISTORY: Significant for endstage renal disease on dialysis, diabetes, hypertension, hyperlipidemia, gastroesophageal reflux disease, back surgeries, and peripheral neuropathy.   PAST SURGICAL HISTORY: Left arm AV fistula.   SOCIAL HISTORY: She does not smoke or drink.   REVIEW OF SYSTEMS: Grossly negative except for what is mentioned in the History of Present Illness.   PHYSICAL EXAMINATION:  GENERAL: Fairly well built female did not appear to be in any acute distress, quite awake and alert. Clinically does not appear to be severely anemic.   VITAL SIGNS: Vitals are stable with a temperature of 98.2. Heart rate is in 80s.   LUNGS: Grossly clear to auscultation bilaterally with fair air entry.   CARDIOVASCULAR: Regular rate and rhythm. No gallops or murmur.   ABDOMEN: Quite soft and benign. Bowel sounds positive. Nontender, nondistended. No rebound or guarding was noted.   NEUROLOGIC: Examination appears to be intact.   LABORATORY DATA: Hemoglobin was 11.5 on admission, which has slowly dropped down to 10.6. Electrolytes fine. BUN 40, creatinine 7.24.   ASSESSMENT AND PLAN: The patient  is with hematochezia. She has never had a colonoscopy. The most likely source of bleeding is diverticulosis. The patient presented with a couple of bloody bowel movements, although she did not have any further bleeding in the hospital and remained hemodynamically stable. Her hemoglobin and hematocrit remained stable as well. A colonoscopy was suggested, and she underwent a colonoscopy yesterday which showed large internal hemorrhoids and extensive diverticulosis. No blood or bleeding was noted. Recommend high-fiber diet. Return to the hospital in case of rebleeding. A small polyp was also noted in the ascending colon and was removed with core biopsy forceps. We will follow up on the pathology.    ____________________________ Lurline DelShaukat Letroy Vazguez, MD si:bjt D: 07/27/2011 13:43:37 ET T: 07/27/2011 14:32:45 ET JOB#: [id number]  cc: Lurline DelShaukat Tenika Keeran, MD, <Dictator> Lurline DelSHAUKAT Yusuf Yu MD ELECTRONICALLY SIGNED 07/27/2011 15:09

## 2014-05-09 NOTE — Discharge Summary (Signed)
PATIENT NAME:  Renee Daugherty, Renee Daugherty MR#:  [id number] DATE OF BIRTH:  1926/04/27  DATE OF ADMISSION:  07/23/2011 DATE OF DISCHARGE:  07/26/2011  ADMISSION DIAGNOSIS: Rectal bleeding.  DISCHARGE DIAGNOSES:  1. Lower gastrointestinal bleed, likely diverticulosis. 2. End-stage renal disease, on hemodialysis.  3. History of diabetes.  4. Hyperlipidemia.  5. Neuropathy.   CONSULTS: 1. Dr. Niel HummerIftikhar.  2. Dr. Cherylann RatelLateef.   LABORATORY DATA: Discharge hemoglobin 10.6.   HOSPITAL COURSE: The patient is an 81 year old female who presented with bright red blood per rectum. For further details, please refer to the History and Physical.   1. Lower GI bleed, likely diverticular in nature: She had one episode of bright red blood per rectum on admission, none since then. Her hemoglobin has been stable. She is going for a colonoscopy. If this is normal, the patient may be discharged home.  2. End-stage renal disease, on hemodialysis: The patient did require dialysis on her normal dialysis days here in the hospital.  3. Hyperkalemia from end-stage renal disease: Resolved.  4. Hyperlipidemia: On Pravachol.  5. Diabetes: The patient will resume her outpatient medications. The patient is on Neurontin.   DISCHARGE MEDICATIONS:  1. Actos 45 mg at bedtime.  2. Gabapentin 300 mg at bedtime.  3. Glipizide XL 10 mg, 2 tablets daily.  4. Pravastatin 40 mg daily.  5. Calcium acetate 667, 2 tablets t.i.d.  6. Nexium 40 mg daily.  7. Renvela 800 mg, 2 tablets t.i.d. to q.i.d.  8. Ambien 10 mg at bedtime.  9. Renagel 1 capsule daily.   DISCHARGE DIET: Low sodium, ADA diet.   DISCHARGE ACTIVITY: As tolerated.   DISCHARGE FOLLOWUP:  1. Dr. Niel HummerIftikhar in four weeks.  2. Dr. Burnett ShengHedrick in two weeks.   TIME SPENT:   35 minutes.  ____________________________ Janyth ContesSital P. Juliene PinaMody, MD spm:cbb D: 07/26/2011 13:28:58 ET T: 07/26/2011 15:19:44 ET JOB#: [id number]  cc: Renee Daugherty P. Juliene PinaMody, MD, <Dictator> Lurline DelShaukat Iftikhar, MD Rhona LeavensJames F.  Burnett ShengHedrick, MD Janyth ContesSITAL P Railee Bonillas MD ELECTRONICALLY SIGNED 07/27/2011 13:20

## 2014-05-09 NOTE — Consult Note (Signed)
Brief Consult Note: Diagnosis: Hematochezia.   Patient was seen by consultant.   Orders entered.   Comments: Hematochezia most likely secondary to diverticulosis. No signs of active GI bleed at this time. Hemodynamically stable.  Recommendations: Switch to clear liquids in preparation of colonoscopy. Follow H and H and transfuse if needed. Colonoscopy Thursday. Please obtain a bleeding scan if signs of active bleeding. Will follow.  Electronic Signatures: Lurline DelIftikhar, Johnathin Vanderschaaf (MD)  (Signed 09-Jul-13 20:46)  Authored: Brief Consult Note   Last Updated: 09-Jul-13 20:46 by Lurline DelIftikhar, Tessia Kassin (MD)

## 2014-05-09 NOTE — Consult Note (Signed)
Chief Complaint:   Subjective/Chief Complaint Colonoscopy showed pan-diverticulosis. No blood or active bleeding. A small polyp, removed. Internal hemorrhoids.  Agree with discharge. No further recommendations.   Electronic Signatures: Lurline DelIftikhar, Hebert Dooling (MD)  (Signed 11-Jul-13 18:05)  Authored: Chief Complaint   Last Updated: 11-Jul-13 18:05 by Lurline DelIftikhar, Raliyah Montella (MD)

## 2014-05-09 NOTE — H&P (Signed)
PATIENT NAME:  Renee Daugherty, AMMON MR#:  [id number] DATE OF BIRTH:  Apr 15, 1926  DATE OF ADMISSION:  07/23/2011  PRIMARY CARE PHYSICIAN: Dr. Burnett Sheng.   CHIEF COMPLAINT: Bright red blood per rectum x2 days.   HISTORY OF PRESENT ILLNESS: Renee Daugherty is an 81 year old pleasant Caucasian female with history of end stage renal disease on hemodialysis, systemic hypertension, and diabetes mellitus type 2. The patient was in her usual state of health until yesterday when she is said to have mild bright red per rectum, however, the bleeding was more today. She had a large bowel movement that was frank blood, just one bowel movement. She was brought by her family to the Emergency Department for evaluation. Her hemoglobin is not far from her baseline. Rectal examination revealed fresh red blood on the glove. There were some hemorrhoids, but not actively bleeding. The patient was admitted for further evaluation of her lower gastrointestinal bleed. She indicates that she has mild soreness at the lower part of the abdomen at the periumbilical area and to the sides for the last 24 hours. This is not bad and not severe at all. There is no associated nausea or vomiting.   REVIEW OF SYSTEMS: CONSTITUTIONAL: Denies having any fever. No chills. No fatigue. EYES: No blurring of vision. No double vision. ENT: No hearing impairment. No sore throat. No dysphagia. CARDIOVASCULAR: No chest pain. No shortness of breath. No syncope, but she has some edema. RESPIRATORY: No shortness of breath. No chest pain. No cough. GASTROINTESTINAL: Mild pain at the periumbilical area that is not very impressive. No vomiting, no diarrhea. GENITOURINARY: No dysuria or frequency of urination. She states that she does not make any urine. MUSCULOSKELETAL: No joint pain or swelling. No muscular pain or swelling. INTEGUMENTARY: No skin rash. No ulcers. NEUROLOGY: No focal weakness. No seizure activity. No headache. PSYCHIATRY: No anxiety or depression.  ENDOCRINE: No polyuria or polydipsia. No heat or cold intolerance.   PAST MEDICAL HISTORY:  1. History of end stage renal disease on hemodialysis on Tuesday, Thursday and Saturday.  2. Systemic hypertension.  3. Diabetes mellitus, type 2.  4. Hyperlipidemia.  5. Anemia of chronic disease. 6. Gastroesophageal reflux disease.  7. Diabetic peripheral neuropathy. 8. Degenerative disk disease of the back.   PAST SURGICAL HISTORY: Left arm AV fistula formation.   SOCIAL HABITS: Nonsmoker. No history of alcohol or drug abuse.   SOCIAL HISTORY: She is widowed. Lives at home alone independent.   FAMILY HISTORY: Negative for gastrointestinal bleed and negative for advanced kidney disease, but there is strong family history of diabetes. This is involving her sister, her son and her father.   ADMISSION MEDICATIONS:  1. Ambien 10 mg at night. 2. Renvela 800 mg 2 tablets 3 times a day.  3. Renal capsule once a day. 4. Pravastatin 40 mg a day. 5. Nexium 40 mg a day. 6. Glipizide XL 10 mg 2 tablets once a day. 7. Gabapentin 300 mg at bedtime.  8. Calcium acetate 667 mg 2 capsules 3 times a day.  9. Actos 45 mg once a day.   ALLERGIES: No known drug allergies.   PHYSICAL EXAMINATION:  VITAL SIGNS: Blood pressure 135/55, respiratory rate 18, pulse 80, temperature 97.9, oxygen saturation 99%.   GENERAL APPEARANCE: Elderly lady laying in bed in no acute distress.   HEAD AND NECK EXAMINATION: No pallor. No icterus. No cyanosis.   ENT: Hearing was normal. Nasal mucosa, lips, tongue were normal.   EYES: Normal iris and conjunctivae. Pupils  about 5 mm, equal and reactive to light.   NECK: Supple. Trachea at midline. No thyromegaly. No cervical lymphadenopathy. No masses.   HEART: Normal S1, S2. No S3, S4. No murmur. No gallop. No carotid bruits.   RESPIRATORY: Normal breathing pattern without use of accessory muscles. No rales. No wheezing.   ABDOMEN: Soft without tenderness. No  hepatosplenomegaly. No masses. No rigidity. No rebound.   MUSCULOSKELETAL: No joint swelling. No clubbing.   SKIN: No ulcers. No subcutaneous nodules.   NEUROLOGIC: Cranial nerves II through XII are intact. No focal motor deficit.   PSYCHIATRIC: The patient is alert and oriented x3. Mood and affect were normal.   LABORATORY, DIAGNOSTIC, AND RADIOLOGICAL DATA: EKG showed normal sinus rhythm at rate of 76 per minute, incomplete right bundle branch block, otherwise unremarkable  EKG. Serum glucose 150, BUN 50, creatinine 8.2, sodium 142, and potassium 6.1. Lipase 379. Normal liver function tests. CBC showed white count 6,000, hemoglobin 11, hematocrit 35, platelet count 262.   ASSESSMENT:  1. Rectal bleeding. Digital rectal exam revealed bright blood over the glove. But no lesions other than the hemorrhoids that are not actively bleeding.  2. Hyperkalemia.  3. End-stage renal disease on hemodialysis.  4. Systemic hypertension.  5. Diabetes mellitus, type 2.  6. Anemia of chronic disease.  7. Hyperlipidemia.  8. Gastroesophageal reflux. 9. Diabetic peripheral neuropathy.   PLAN:  1. We will admit the patient to the medical floor.  2. Follow-up on hemoglobin and hematocrit tomorrow morning.  3. Obtain gastroenterology consultation.  4. Consult nephrology since her dialysis session is tomorrow.  5. For her hyperkalemia, the patient received an injection of dextrose along with calcium and bicarbonate along with 5 units of regular insulin.  6. Will follow up on her basic metabolic profile after her dialysis tomorrow.  7. I will continue the rest of her home medications.  8. The patient indicates that she does not have a LIVING WILL nor has she appointed anybody to have the power of attorney. She does not specify reasons.   TIME SPENT: Time spent in evaluating this patient including reviewing her medical records took more than 55 minutes.   ____________________________ Carney CornersAmir M. Rudene Rearwish,  MD amd:ap D: 07/23/2011 23:47:41 ET T: 07/24/2011 07:01:26 ET JOB#: [id number]  cc: Carney CornersAmir M. Rudene Rearwish, MD, <Dictator> Rhona LeavensJames F. Burnett ShengHedrick, MD Carney CornersAMIR M British Moyd MD ELECTRONICALLY SIGNED 07/26/2011 1:35

## 2014-05-09 NOTE — Consult Note (Signed)
Chief Complaint:   Subjective/Chief Complaint No further bleeding. H and H stable. Will proceed with a colonoscopy tomorrow afternoon after dialysis in am.   Electronic Signatures: Lurline DelIftikhar, Colston Pyle (MD)  (Signed 10-Jul-13 17:58)  Authored: Chief Complaint   Last Updated: 10-Jul-13 17:58 by Lurline DelIftikhar, Demetrious Rainford (MD)

## 2014-05-16 NOTE — Op Note (Signed)
PATIENT NAME:  Renee Daugherty, Renee Daugherty MR#:  [id number] DATE OF BIRTH:  1926/10/19  DATE OF PROCEDURE:  03/30/2014  PREOPERATIVE DIAGNOSES:  1.  Complication of arteriovenous dialysis access.  2.  End-stage renal disease requiring hemodialysis.  3.  Hypertension.   POSTOPERATIVE DIAGNOSIS: 1.  Complication of arteriovenous dialysis access.  2.  End-stage renal disease requiring hemodialysis.  3.  Hypertension.   PROCEDURES PERFORMED:  Contrast injection left arm brachiocephalic fistula.   PROCEDURE PERFORMED BY: Levora DredgeGregory Joellen Tullos, M.D.   SEDATION:  Versed plus fentanyl.   CONTRAST USED: Isovue 55 mL.   FLUOROSCOPY TIME: 19.4 minutes.   ACCESS:  A 6 French sheath, antegrade direction, left arm AV fistula.   INDICATIONS: Renee Daugherty is an 81 year old woman who presents with problems with her dialysis access. Noninvasive studies demonstrate abnormalities up in the shoulder area although it is uncertain as to what is going on secondary to the extensive collaterals noted. Risks and benefits for angiography and possible intervention are reviewed. All questions answered. The patient agrees to proceed.   DESCRIPTION OF PROCEDURE: The patient is taken to the special procedure suite, placed in the supine position. After adequate sedation is achieved, she is positioned supine with her left arm extended palm upward. The left arm is prepped and draped in sterile fashion. Appropriate timeout is called.   Micropuncture needle is inserted followed by microwire micro sheath, J-wire followed by a 6 French sheath. Hand injection of contrast is used to demonstrate the fistula at the shoulder. There does appear to be numerous collaterals with a somewhat abnormal confluence of the cephalic and subclavian veins. There appears to be several high-grade stenoses, but there is significant tortuosity.   Heparin 3000 units is given and subsequently a combination of Glidewire, advantage wire, gold-tipped glide wire, as well  as several different catheters is used to try to negotiate through this tortuosity and into the true subclavian so that a stent could be placed. Unfortunately these maneuvers were not successful. The patient therefore, had pursestring suture placed and the sheath removed. There are no immediate complications.   INTERPRETATION:  Initial views demonstrate the fistula itself is patent. There are aneurysmal changes to the area of arterial and venous access. However more proximal in the arm the cephalic vein measures 5 to 6 mm and is widely patent. At the shoulder, there exists extensive collaterals some of which retrograde down the arm slightly past the level of the deltoid, and then curve back into the axillary vein. Axillary vein fills and appears normal anatomically. There is distortion of many collaterals of the cephalic confluence with one that appears to be dominant but would likely be a collateral of the true cephalic confluence. The axillary, subclavian, innominate and superior vena cava appear patent.   SUMMARY:  Extensive collaterals with multiple occlusions within the true cephalic vein, which is leading to the low flow and marked pulsatility of her fistula, however there does appear to be normal axillary vein, which is reconstituted by these collaterals and, therefore, the patient is a candidate for a jump graft from the more proximal cephalic down to the true axillary.       ____________________________ Renford DillsGregory G. Dashun Borre, MD ggs:at D: 03/30/2014 14:43:54 ET T: 03/30/2014 17:46:29 ET JOB#: [id number]  cc: Renford DillsGregory G. Jordyn Doane, MD, <Dictator> Renford DillsGREGORY G Moise Friday MD ELECTRONICALLY SIGNED 04/27/2014 15:08

## 2014-05-16 NOTE — Op Note (Signed)
PATIENT NAME:  Renee Daugherty, Renee Daugherty MR#:  [id number] DATE OF BIRTH:  07-10-1926  DATE OF PROCEDURE:  04/14/2014  PREOPERATIVE DIAGNOSES: 1.  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  2.  Complication of dialysis with aneurysmal deterioration of the fistula and poor dialysis runs associated with prolonged bleeding post dialysis.  3.  End-stage renal disease requiring hemodialysis.  4.  Hypertension.   POSTOPERATIVE DIAGNOSES:  1.  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  2.  Complication of dialysis with aneurysmal deterioration of the fistula and poor dialysis runs associated with prolonged bleeding post dialysis.  3.  End-stage renal disease requiring hemodialysis.  4.  Hypertension.   PROCEDURE PERFORMED: Revision of left arm brachiocephalic fistula with a jump graft from the cephalic vein to the axillary vein using a 7 mm Artegraft.   SURGEON: Levora DredgeGregory Osiris Charles, M.D.   ANESTHESIA: General by LMA.   FLUIDS: Per anesthesia record.   ESTIMATED BLOOD LOSS: 150 mL.   SPECIMEN: None.   INDICATIONS: [name] is an 81 year old woman who has been having increasing problems with dialysis, worsening of her parameters and increased bleeding. Angiography demonstrated stricture stenosis with subtotal occlusion of the cephalic vein at the confluence with the subclavian vein. Attempts at crossing this area were unsuccessful and she is therefore undergoing surgical revision. The risks and benefits are reviewed. All questions answered. The patient agrees to proceed.   DESCRIPTION OF PROCEDURE: The patient is taken to the operating room and placed in the supine position. After adequate general anesthesia is induced and appropriate invasive monitors are placed, she is positioned supine and her left arm is prepped and draped in sterile fashion. Appropriate timeout is called.    A curvilinear incision is created in the anterior axillary line extending laterally toward the palpable cephalic vein, at the level of the deltoid. Dissection is carried down through the soft tissues and the cephalic vein is identified. It is then dissected circumferentially for a distance of approximately 2 to 3 cm.   The fascia is then opened and the axillary vein is identified and dissected circumferentially. Vessel loops are passed proximally and distally. Then 3000 units of heparin is given. A 7 mm x 40 cm Artegraft is prepped on the back table.   Occlusion proximally and distally for vascular control is then performed on the fistula. On the cephalic vein, arteriotomy is made and extended with Potts scissors and an end Artegraft to side vein anastomosis is fashioned with running 6-0 Prolene. The vein is then stretched to the axillary vein, the graft with appropriate length is marked, the vein is delivered into the field, controlled with vessel loops, Artegraft is cut and beveled and an end to vein to side graft anastomosis is fashioned with running 6-0 Prolene. Flushing maneuvers are performed and flow is now established through the jump graft. Excellent thrill is noted in the fistula once again with elimination of the pulsatility. Excellent thrill is noted through the graft. Both anastomoses are inspected for hemostasis and subsequently Surgicel is placed. The wounds are irrigated with 200 mL sterile saline and then closed in multiple layers using 3-0 Vicryl followed by 4-0 Monocryl subcuticular and Dermabond. The patient tolerated the procedure well. There were no immediate complications. Sponge and needle counts are correct and she is taken to the recovery area in good condition.  ____________________________ Renford DillsGregory G. Mohamud Mrozek, MD ggs:sb D: 04/16/2014 10:20:41 ET  T: 04/16/2014 11:58:29 ET JOB#: [id number]  cc: Renford Dills, MD, <Dictator> Renford Dills MD ELECTRONICALLY SIGNED  04/27/2014 15:11

## 2014-08-23 LAB — BASIC METABOLIC PANEL
ANION GAP: 12 (ref 7–16)
BUN: 25 mg/dL — AB
CALCIUM: 9.3 mg/dL
CO2: 31 mmol/L
Chloride: 94 mmol/L — ABNORMAL LOW
Creatinine: 6.02 mg/dL — ABNORMAL HIGH
EGFR (Non-African Amer.): 6 — ABNORMAL LOW
GFR CALC AF AMER: 7 — AB
GLUCOSE: 165 mg/dL — AB
POTASSIUM: 4.3 mmol/L
SODIUM: 137 mmol/L

## 2014-08-23 LAB — CBC
HCT: 34.8 % — AB (ref 35.0–47.0)
HGB: 11 g/dL — AB (ref 12.0–16.0)
MCH: 32.7 pg (ref 26.0–34.0)
MCHC: 31.6 g/dL — ABNORMAL LOW (ref 32.0–36.0)
MCV: 104 fL — AB (ref 80–100)
Platelet: 201 10*3/uL (ref 150–440)
RBC: 3.36 10*6/uL — ABNORMAL LOW (ref 3.80–5.20)
RDW: 16.3 % — ABNORMAL HIGH (ref 11.5–14.5)
WBC: 4.8 10*3/uL (ref 3.6–11.0)

## 2014-08-23 LAB — PROTIME-INR
INR: 1
PROTHROMBIN TIME: 12.9 s

## 2014-08-23 LAB — APTT: Activated PTT: 31.6 secs (ref 23.6–35.9)

## 2014-09-20 IMAGING — CR DG CHEST 1V PORT
1 series · 1 of 1 positions shown · non-contrast
Comparison: none

REASON FOR EXAM: Post op fever. Complete erect. Thanks.
COMMENTS:

PROCEDURE:     DXR - DXR PORTABLE CHEST SINGLE VIEW  - January 05, 2012  [DATE]
RESULT:     Comparison: None

[ap]
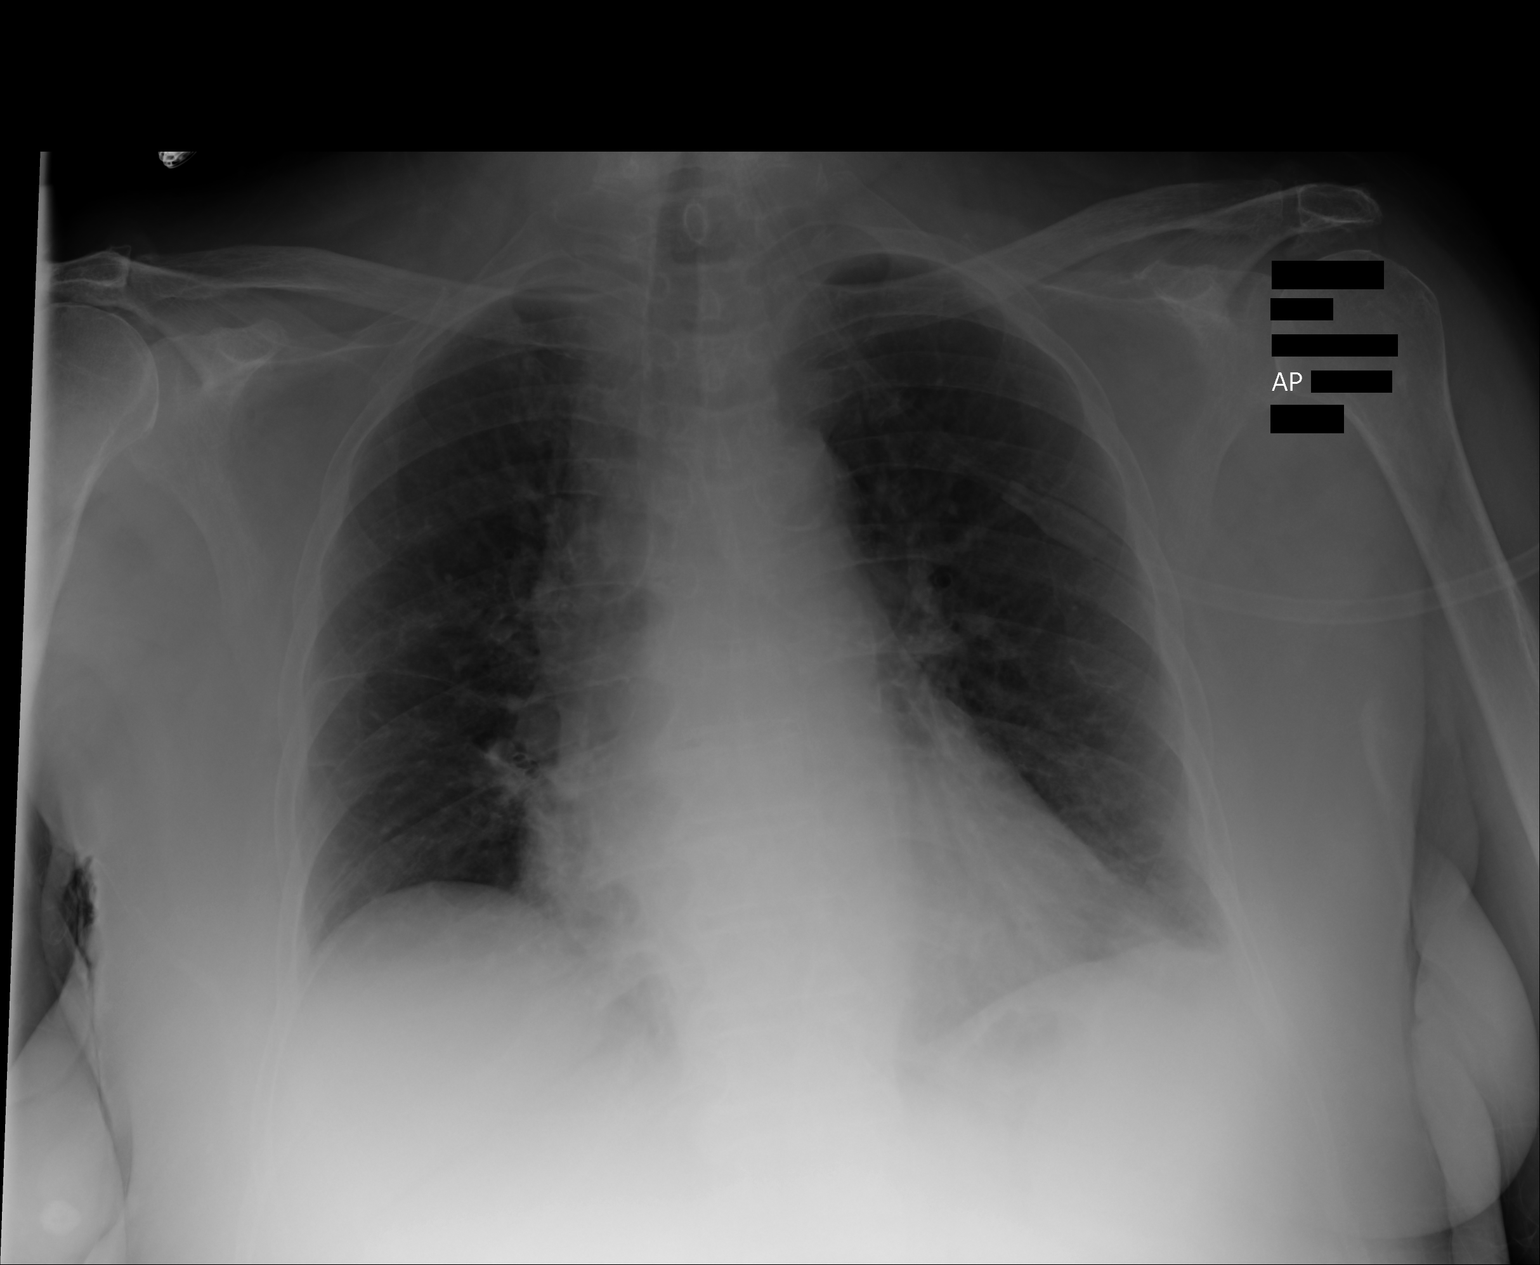

[1 of 1 positions shown; findings below may reference images not displayed]

FINDINGS: Single portable AP chest radiograph is provided.  There is no focal
parenchymal opacity, pleural effusion, or pneumothorax. Normal
cardiomediastinal silhouette. The osseous structures are unremarkable.
IMPRESSION: No acute disease of the che[REDACTED]

## 2014-12-02 ENCOUNTER — Other Ambulatory Visit: Payer: Self-pay | Admitting: Vascular Surgery

## 2014-12-06 ENCOUNTER — Ambulatory Visit
Admission: RE | Admit: 2014-12-06 | Discharge: 2014-12-06 | Disposition: A | Discharge status: Home / Self Care | Payer: Medicare Other | Source: Ambulatory Visit | Attending: Vascular Surgery | Admitting: Vascular Surgery

## 2014-12-06 ENCOUNTER — Encounter
Admission: RE | Disposition: A | Discharge status: Home / Self Care | Payer: Self-pay | Source: Ambulatory Visit | Attending: Vascular Surgery

## 2014-12-06 ENCOUNTER — Encounter: Payer: Self-pay | Admitting: *Deleted

## 2014-12-06 DIAGNOSIS — K219 Gastro-esophageal reflux disease without esophagitis: Secondary | ICD-10-CM | POA: Insufficient documentation

## 2014-12-06 DIAGNOSIS — T82858A Stenosis of vascular prosthetic devices, implants and grafts, initial encounter: Secondary | ICD-10-CM | POA: Insufficient documentation

## 2014-12-06 DIAGNOSIS — N186 End stage renal disease: Secondary | ICD-10-CM | POA: Diagnosis not present

## 2014-12-06 DIAGNOSIS — E1122 Type 2 diabetes mellitus with diabetic chronic kidney disease: Secondary | ICD-10-CM | POA: Diagnosis not present

## 2014-12-06 DIAGNOSIS — I120 Hypertensive chronic kidney disease with stage 5 chronic kidney disease or end stage renal disease: Secondary | ICD-10-CM | POA: Diagnosis not present

## 2014-12-06 DIAGNOSIS — Z9049 Acquired absence of other specified parts of digestive tract: Secondary | ICD-10-CM | POA: Insufficient documentation

## 2014-12-06 DIAGNOSIS — Y832 Surgical operation with anastomosis, bypass or graft as the cause of abnormal reaction of the patient, or of later complication, without mention of misadventure at the time of the procedure: Secondary | ICD-10-CM | POA: Insufficient documentation

## 2014-12-06 DIAGNOSIS — Z992 Dependence on renal dialysis: Secondary | ICD-10-CM | POA: Insufficient documentation

## 2014-12-06 HISTORY — DX: Gastro-esophageal reflux disease without esophagitis: K21.9

## 2014-12-06 HISTORY — DX: Peripheral vascular disease, unspecified: I73.9

## 2014-12-06 HISTORY — PX: PERIPHERAL VASCULAR CATHETERIZATION: SHX172C

## 2014-12-06 HISTORY — DX: Lymphedema, not elsewhere classified: I89.0

## 2014-12-06 HISTORY — DX: Chronic kidney disease, unspecified: N18.9

## 2014-12-06 HISTORY — DX: Type 2 diabetes mellitus without complications: E11.9

## 2014-12-06 LAB — POTASSIUM: Potassium: 4.7 mmol/L (ref 3.5–5.1)

## 2014-12-06 SURGERY — A/V SHUNTOGRAM/FISTULAGRAM
Anesthesia: Moderate Sedation

## 2014-12-06 MED ORDER — IOHEXOL 300 MG/ML  SOLN
INTRAMUSCULAR | Status: DC | PRN
  Administered 2014-12-06: 35 mL via INTRA_ARTERIAL

## 2014-12-06 MED ORDER — PHENOL 1.4 % MT LIQD: 1.0000 | OROMUCOSAL | Status: DC | PRN

## 2014-12-06 MED ORDER — FENTANYL CITRATE (PF) 100 MCG/2ML IJ SOLN
INTRAMUSCULAR | Status: AC
  Filled 2014-12-06: qty 2

## 2014-12-06 MED ORDER — SODIUM CHLORIDE 0.9 % IV SOLN
INTRAVENOUS | Status: DC
  Administered 2014-12-06 (×2): via INTRAVENOUS

## 2014-12-06 MED ORDER — LABETALOL HCL 5 MG/ML IV SOLN: 10.0000 mg | INTRAVENOUS | Status: DC | PRN

## 2014-12-06 MED ORDER — DEXTROSE 5 % IV SOLN
1.5000 g | INTRAVENOUS | Status: AC
  Administered 2014-12-06: 1.5 g via INTRAVENOUS
  Filled 2014-12-06: qty 1.5

## 2014-12-06 MED ORDER — INSULIN ASPART 100 UNIT/ML ~~LOC~~ SOLN: 0.0000 [IU] | SUBCUTANEOUS | Status: DC

## 2014-12-06 MED ORDER — SODIUM CHLORIDE 0.9 % IV SOLN: 500.0000 mL | Freq: Once | INTRAVENOUS | Status: DC | PRN

## 2014-12-06 MED ORDER — ACETAMINOPHEN 325 MG RE SUPP: 325.0000 mg | RECTAL | Status: DC | PRN

## 2014-12-06 MED ORDER — ALUM & MAG HYDROXIDE-SIMETH 200-200-20 MG/5ML PO SUSP: 15.0000 mL | ORAL | Status: DC | PRN

## 2014-12-06 MED ORDER — METOPROLOL TARTRATE 1 MG/ML IV SOLN: 2.0000 mg | INTRAVENOUS | Status: DC | PRN

## 2014-12-06 MED ORDER — OXYCODONE-ACETAMINOPHEN 5-325 MG PO TABS: 1.0000 | ORAL_TABLET | ORAL | Status: DC | PRN

## 2014-12-06 MED ORDER — HYDRALAZINE HCL 20 MG/ML IJ SOLN: 5.0000 mg | INTRAMUSCULAR | Status: DC | PRN

## 2014-12-06 MED ORDER — MIDAZOLAM HCL 5 MG/5ML IJ SOLN
INTRAMUSCULAR | Status: AC
  Filled 2014-12-06: qty 5

## 2014-12-06 MED ORDER — FENTANYL CITRATE (PF) 100 MCG/2ML IJ SOLN
INTRAMUSCULAR | Status: DC | PRN
  Administered 2014-12-06: 50 ug via INTRAVENOUS

## 2014-12-06 MED ORDER — GUAIFENESIN-DM 100-10 MG/5ML PO SYRP: 15.0000 mL | ORAL_SOLUTION | ORAL | Status: DC | PRN

## 2014-12-06 MED ORDER — HEPARIN SODIUM (PORCINE) 1000 UNIT/ML IJ SOLN
INTRAMUSCULAR | Status: DC | PRN
  Administered 2014-12-06: 3000 [IU] via INTRAVENOUS

## 2014-12-06 MED ORDER — MIDAZOLAM HCL 2 MG/2ML IJ SOLN
INTRAMUSCULAR | Status: DC | PRN
  Administered 2014-12-06: 2 mg via INTRAVENOUS

## 2014-12-06 MED ORDER — MORPHINE SULFATE (PF) 4 MG/ML IV SOLN: 2.0000 mg | INTRAVENOUS | Status: DC | PRN

## 2014-12-06 MED ORDER — ACETAMINOPHEN 325 MG PO TABS: 325.0000 mg | ORAL_TABLET | ORAL | Status: DC | PRN

## 2014-12-06 MED ORDER — ONDANSETRON HCL 4 MG/2ML IJ SOLN: 4.0000 mg | Freq: Four times a day (QID) | INTRAMUSCULAR | Status: DC | PRN

## 2014-12-06 MED ORDER — HEPARIN SODIUM (PORCINE) 1000 UNIT/ML IJ SOLN
INTRAMUSCULAR | Status: AC
  Filled 2014-12-06: qty 1

## 2014-12-06 SURGICAL SUPPLY — 10 items
BALLN ULTRVRSE 8X60X75C (BALLOONS) ×4
BALLOON ULTRVRSE 8X60X75C (BALLOONS) ×2 IMPLANT
CANNULA 5F STIFF (CANNULA) ×4 IMPLANT
DEVICE PRESTO INFLATION (MISCELLANEOUS) ×4 IMPLANT
DRAPE BRACHIAL (DRAPES) ×4 IMPLANT
PACK ANGIOGRAPHY (CUSTOM PROCEDURE TRAY) ×4 IMPLANT
SHEATH BRITE TIP 6FRX5.5 (SHEATH) ×4 IMPLANT
SUT MNCRL AB 4-0 PS2 18 (SUTURE) ×4 IMPLANT
TOWEL OR 17X26 4PK STRL BLUE (TOWEL DISPOSABLE) ×4 IMPLANT
WIRE MAGIC TOR.035 180C (WIRE) ×4 IMPLANT

## 2014-12-06 NOTE — H&P (Signed)
[facility] Admission History & Physical  MRN : [id number]  Renee Daugherty is a 81 y.o. ([birth year]) female who presents with chief complaint of No chief complaint on file. Marland Kitchen  History of Present Illness: Patient is an 81 year old female sent from her dialysis access center for evaluation of her access. This has had diminished flow and low rates over the past few weeks. It had worked reasonably well for a few months prior to this. She has no pain. She has no fevers or chills or signs of systemic infection. She denies any uremic symptoms such as anorexia, lethargy, and does not appear to have volume overload.  Current Facility-Administered Medications  Medication Dose Route Frequency Provider Last Rate Last Dose  . cefUROXime (ZINACEF) 1.5 g in dextrose 5 % 50 mL IVPB  1.5 g Intravenous 30 min Pre-Op Tonette Lederer, PA-C        Past Medical History  Diagnosis Date  . Chronic kidney disease   . GERD (gastroesophageal reflux disease)   . Diabetes mellitus without complication [facility])     Past Surgical History  Procedure Laterality Date  . Cholecystectomy      Social History Nonsmoker No ETOH use No IVDU  Family History No bleeding disorders, clotting disorders, or autoimmune diseases  Not on File   REVIEW OF SYSTEMS (Negative unless checked)  Constitutional: Weight loss  Fever  Chills Cardiac: Chest pain   Chest pressure   Palpitations   Shortness of breath when laying flat   Shortness of breath at rest   Shortness of breath with exertion. Vascular:  Pain in legs with walking   Pain in legs at rest   Pain in legs when laying flat   Claudication   Pain in feet when walking  Pain in feet at rest  Pain in feet when laying flat   History of DVT   Phlebitis   Swelling in legs   Varicose veins   Non-healing ulcers Pulmonary:   Uses home oxygen   Productive cough   Hemoptysis   Wheeze  COPD    Asthma Neurologic:  Dizziness  Blackouts   Seizures   History of stroke   History of TIA  Aphasia   Temporary blindness   Dysphagia   Weakness or numbness in arms   Weakness or numbness in legs Musculoskeletal:  Arthritis   Joint swelling   Joint pain   Low back pain Hematologic:  Easy bruising  Easy bleeding   Hypercoagulable state   Anemic  Hepatitis Gastrointestinal:  Blood in stool   Vomiting blood  Gastroesophageal reflux/heartburn   Difficulty swallowing. Genitourinary:  Chronic kidney disease   Difficult urination  Frequent urination  Burning with urination   Blood in urine Skin:  Rashes   Ulcers   Wounds Psychological:  History of anxiety    History of major depression.  Physical Examination  Filed Vitals:   12/06/14 1011  BP: 121/57  Temp: 98.3 F (36.8 C)  Resp: 18  Height: 5' 2.5" (1.588 m)  Weight: 84.823 kg (187 lb)  SpO2: 97%   Body mass index is 33.64 kg/(m^2). Gen: WD/WN, NAD Head: [place]/AT, No temporalis wasting. Prominent temp pulse not noted. Ear/Nose/Throat: Hearing grossly intact, nares w/o erythema or drainage, oropharynx w/o Erythema/Exudate,  Eyes: PERRLA, EOMI.  Neck: Supple, no nuchal rigidity.  No bruit or JVD.  Pulmonary:  Good air movement, clear to auscultation bilaterally, no use of accessory muscles.  Cardiac: RRR, normal S1, S2,  no Murmurs, rubs or gallops. Vascular: access has bruit, somewhat pulsatile Vessel Right Left  Radial Palpable Palpable                                   Gastrointestinal: soft, non-tender/non-distended. No guarding/reflex.  Musculoskeletal: M/S 5/5 throughout.  Extremities without ischemic changes.  No deformity or atrophy.  Neurologic: CN 2-12 intact. Pain and light touch intact in extremities.  Symmetrical.  Speech is fluent. Motor exam as listed above. Psychiatric: Judgment intact, Mood & affect appropriate for pt's clinical  situation. Dermatologic: No rashes or ulcers noted.  No cellulitis or open wounds. Lymph : No Cervical, Axillary, or Inguinal lymphadenopathy.     CBC Lab Results  Component Value Date   WBC 4.8 04/07/2014   HGB 11.0* 04/07/2014   HCT 34.8* 04/07/2014   MCV 104* 04/07/2014   PLT 201 04/07/2014    BMET    Component Value Date/Time   NA 137 04/07/2014 1451   K 4.3 04/07/2014 1451   CL 94* 04/07/2014 1451   CO2 31 04/07/2014 1451   GLUCOSE 165* 04/07/2014 1451   BUN 25* 04/07/2014 1451   CREATININE 6.02* 04/07/2014 1451   CALCIUM 9.3 04/07/2014 1451   GFRNONAA 6* 04/07/2014 1451   GFRAA 7* 04/07/2014 1451   CrCl cannot be calculated (Patient has no serum creatinine result on file.).  COAG Lab Results  Component Value Date   INR 1.0 04/07/2014    Radiology No results found.    Assessment/Plan 1. Dysfunction of dialysis access.  Will plan fistulagram today for further evaluation and potential treatment.  Risks and benefits discussed. 2. ESRD. Needs better flow from access. See above 3. HTN. Stable. Continue outpatient meds   DEW,JASON, MD  12/06/2014 10:17 AM

## 2014-12-06 NOTE — Op Note (Signed)
VEIN AND VASCULAR SURGERY    OPERATIVE NOTE   PROCEDURE: 1.   Left brachiocephalic arteriovenous fistula cannulation under ultrasound guidance 2.   Left arm fistulagram including central venogram 3.   Percutaneous transluminal angioplasty of what appeared to be a jump graft from the cephalic vein to the axillary vein at the shoulder with an 8 mm diameter by 6 cm length angioplasty balloon  PRE-OPERATIVE DIAGNOSIS: 1. ESRD 2. Poorly functional left brachiocephalic AVF  POST-OPERATIVE DIAGNOSIS: same as above   SURGEON: Leotis Pain, MD  ANESTHESIA: local with MCS  ESTIMATED BLOOD LOSS: Minimal  FINDING(S): 1. Mild stenosis near the anastomosis in the 40-50% range, moderate stenosis in the 70% range and what appeared to be a jump graft between the cephalic vein and the axillary vein at the shoulder  SPECIMEN(S):  None  CONTRAST: 35 cc  INDICATIONS: Renee Daugherty is a 81 y.o. female who presents with malfunctioning  left arm arteriovenous fistula.  The patient is scheduled for  left arm fistulagram.  The patient is aware the risks include but are not limited to: bleeding, infection, thrombosis of the cannulated access, and possible anaphylactic reaction to the contrast.  The patient is aware of the risks of the procedure and elects to proceed forward.  DESCRIPTION: After full informed written consent was obtained, the patient was brought back to the angiography suite and placed supine upon the angiography table.  The patient was connected to monitoring equipment.  The  left arm was prepped and draped in the standard fashion for a percutaneous access intervention.  Under ultrasound guidance, the  aneurysmal portion at the arterial access site of the left brachiocephalic arteriovenous fistula was cannulated with a micropuncture needle under direct ultrasound guidance in an antegrade direction and a permanent image was performed.  The microwire was advanced into the fistula and the  needle was exchanged for the a microsheath.  I then upsized to a 6 Fr Sheath and imaging was performed.  Hand injections were completed to image the access including the central venous system. This demonstrated what appeared to be an occluded cephalic vein near the subclavian vein confluence, but what had the appearance of a jump graft between the cephalic vein and the axillary vein at the shoulder. This had a moderate stenosis that was not seen initially on AP imaging but with caudal projection was better identified. The remainder of the central venous system was patent. With compression of the fistula, imaging the arterial anastomosis showed some degree of stenosis in the perianastomotic region that was approaching 50% but did not appear high-grade.  Based on the images, this patient will need intervention for the vein/bypass at the shoulder. I then gave the patient 3000 units of intravenous heparin.  I then crossed the stenosis with a Magic Tourqe wire.  Based on the imaging, a 8 mm x 6 cm  angioplasty balloon was selected.  The balloon was centered around the vein/bypass from the cephalic vein to the axillary vein at the shoulder to encompass the stenosis and inflated to 8 ATM for 1 minute(s).  On completion imaging, a 15-20 % residual stenosis was present.     Based on the completion imaging, no further intervention is necessary.  The wire and balloon were removed from the sheath.  A 4-0 Monocryl purse-string suture was sewn around the sheath.  The sheath was removed while tying down the suture.  A sterile bandage was applied to the puncture site.  COMPLICATIONS: None  CONDITION:  Stable   DEW,JASON  12/06/2014 12:06 PM

## 2014-12-06 NOTE — Discharge Instructions (Signed)
Fistulogram, Care After °Refer to this sheet in the next few weeks. These instructions provide you with information on caring for yourself after your procedure. Your health care provider may also give you more specific instructions. Your treatment has been planned according to current medical practices, but problems sometimes occur. Call your health care provider if you have any problems or questions after your procedure. °WHAT TO EXPECT AFTER THE PROCEDURE °After your procedure, it is typical to have the following: °· A small amount of discomfort in the area where the catheters were placed. °· A small amount of bruising around the fistula. °· Sleepiness and fatigue. °HOME CARE INSTRUCTIONS °· Rest at home for the day following your procedure. °· Do not drive or operate heavy machinery while taking pain medicine. °· Take medicines only as directed by your health care provider. °· Do not take baths, swim, or use a hot tub until your health care provider approves. You may shower 24 hours after the procedure or as directed by your health care provider. °· There are many different ways to close and cover an incision, including stitches, skin glue, and adhesive strips. Follow your health care provider's instructions on: °¨ Incision care. °¨ Bandage (dressing) changes and removal. °¨ Incision closure removal. °· Monitor your dialysis fistula carefully. °SEEK MEDICAL CARE IF: °· You have drainage, redness, swelling, or pain at your catheter site. °· You have a fever. °· You have chills. °SEEK IMMEDIATE MEDICAL CARE IF: °· You feel weak. °· You have trouble balancing. °· You have trouble moving your arms or legs. °· You have problems with your speech or vision. °· You can no longer feel a vibration or buzz when you put your fingers over your dialysis fistula. °· The limb that was used for the procedure: °¨ Swells. °¨ Is painful. °¨ Is cold. °¨ Is discolored, such as blue or pale white. °  °This information is not intended  to replace advice given to you by your health care provider. Make sure you discuss any questions you have with your health care provider. °  °Document Released: 05/18/2013 Document Reviewed: 05/18/2013 °Elsevier Interactive Patient Education ©2016 [employer] ° °

## 2014-12-07 ENCOUNTER — Encounter: Payer: Self-pay | Admitting: Vascular Surgery

## 2014-12-19 ENCOUNTER — Emergency Department
Admission: EM | Admit: 2014-12-19 | Discharge: 2014-12-19 | Disposition: A | Discharge status: Home / Self Care | Payer: Medicare Other | Attending: Emergency Medicine | Admitting: Emergency Medicine

## 2014-12-19 ENCOUNTER — Emergency Department: Payer: Medicare Other

## 2014-12-19 DIAGNOSIS — E119 Type 2 diabetes mellitus without complications: Secondary | ICD-10-CM | POA: Insufficient documentation

## 2014-12-19 DIAGNOSIS — J9801 Acute bronchospasm: Secondary | ICD-10-CM | POA: Insufficient documentation

## 2014-12-19 DIAGNOSIS — R062 Wheezing: Secondary | ICD-10-CM | POA: Diagnosis present

## 2014-12-19 DIAGNOSIS — J209 Acute bronchitis, unspecified: Secondary | ICD-10-CM

## 2014-12-19 DIAGNOSIS — Z79899 Other long term (current) drug therapy: Secondary | ICD-10-CM | POA: Insufficient documentation

## 2014-12-19 LAB — BASIC METABOLIC PANEL
ANION GAP: 14 (ref 5–15)
BUN: 53 mg/dL — ABNORMAL HIGH (ref 6–20)
CALCIUM: 9.6 mg/dL (ref 8.9–10.3)
CO2: 26 mmol/L (ref 22–32)
CREATININE: 5.84 mg/dL — AB (ref 0.44–1.00)
Chloride: 95 mmol/L — ABNORMAL LOW (ref 101–111)
GFR, EST AFRICAN AMERICAN: 7 mL/min — AB (ref >60)
GFR, EST NON AFRICAN AMERICAN: 6 mL/min — AB (ref >60)
GLUCOSE: 325 mg/dL — AB (ref 65–99)
Potassium: 5.3 mmol/L — ABNORMAL HIGH (ref 3.5–5.1)
Sodium: 135 mmol/L (ref 135–145)

## 2014-12-19 LAB — CBC WITH DIFFERENTIAL/PLATELET
BASOS PCT: 0 %
Basophils Absolute: 0 10*3/uL (ref 0–0.1)
EOS ABS: 0 10*3/uL (ref 0–0.7)
EOS PCT: 0 %
HCT: 32.2 % — ABNORMAL LOW (ref 35.0–47.0)
Hemoglobin: 10.7 g/dL — ABNORMAL LOW (ref 12.0–16.0)
LYMPHS ABS: 0.4 10*3/uL — AB (ref 1.0–3.6)
Lymphocytes Relative: 4 %
MCH: 32.7 pg (ref 26.0–34.0)
MCHC: 33.2 g/dL (ref 32.0–36.0)
MCV: 98.5 fL (ref 80.0–100.0)
MONOS PCT: 2 %
Monocytes Absolute: 0.2 10*3/uL (ref 0.2–0.9)
Neutro Abs: 9.4 10*3/uL — ABNORMAL HIGH (ref 1.4–6.5)
Neutrophils Relative %: 94 %
PLATELETS: 209 10*3/uL (ref 150–440)
RBC: 3.27 MIL/uL — ABNORMAL LOW (ref 3.80–5.20)
RDW: 16.9 % — ABNORMAL HIGH (ref 11.5–14.5)
WBC: 10.1 10*3/uL (ref 3.6–11.0)

## 2014-12-19 LAB — GLUCOSE, CAPILLARY: GLUCOSE-CAPILLARY: 293 mg/dL — AB (ref 65–99)

## 2014-12-19 LAB — TROPONIN I: Troponin I: <0.03 ng/mL (ref <0.031)

## 2014-12-19 MED ORDER — IPRATROPIUM-ALBUTEROL 0.5-2.5 (3) MG/3ML IN SOLN
3.0000 mL | Freq: Once | RESPIRATORY_TRACT | Status: AC
  Administered 2014-12-19: 3 mL via RESPIRATORY_TRACT
  Filled 2014-12-19: qty 3

## 2014-12-19 MED ORDER — SODIUM POLYSTYRENE SULFONATE 15 GM/60ML PO SUSP
30.0000 g | Freq: Once | ORAL | Status: AC
  Administered 2014-12-19: 30 g via ORAL
  Filled 2014-12-19: qty 120

## 2014-12-19 MED ORDER — PREDNISONE 20 MG PO TABS
40.0000 mg | ORAL_TABLET | Freq: Once | ORAL | Status: AC
  Administered 2014-12-19: 40 mg via ORAL
  Filled 2014-12-19: qty 2

## 2014-12-19 MED ORDER — PREDNISONE 20 MG PO TABS: ORAL_TABLET | ORAL | Status: DC

## 2014-12-19 NOTE — ED Notes (Signed)
[facility] did an xr - no blood drawn

## 2014-12-19 NOTE — ED Notes (Signed)
Patient transported to CT 

## 2014-12-19 NOTE — ED Notes (Signed)
Walked pt around room, pt maintained 02  sats

## 2014-12-19 NOTE — ED Notes (Addendum)
Pt reports shes been had bronchitis on and off for a year, has nebulizer which is not helping . Non-productive cough present on assessment. Pt reports burning chest pain from coughing

## 2014-12-19 NOTE — ED Notes (Signed)
Bronchitis on and off since last year. Was told today that the bronchitis was back and they called it "copd" which the daughter was worried about. Was put on antibiotic the 28th and has not gotten better. [facility] sent them here.

## 2014-12-19 NOTE — ED Provider Notes (Signed)
[facility] Emergency Department Provider Note   ____________________________________________  Time seen:  I have reviewed the triage vital signs and the triage nursing note.  HISTORY  Chief Complaint Bronchitis   Historian Patient and daughter  HPI Caswell CorwinOlean B Cherly BeachSomers is a 81 y.o. female who is here for evaluation of wheezing. She reports [facility] after presenting there for follow-up of cough/wheezing. She was told to come to the ED for possible hospital admission.  Patient states that for one year she has had trouble with wheezing and trouble breathing, but she has not been diagnosed with asthma, COPD, nor seen a pulmonologist. She has had frequent bouts of respiratory cough and wheezing over the past 1 year. She had about 1-1/2 month break of symptoms from September to November. On November 28 she was seen at [facility] by an advanced practitioner and started on a 10 day course of prednisone, as well as she has been taking Flovent twice daily, and albuterol nebulizer approximately 3 times per day for the past one week.  No increased lower extremity edema. She does do dialysis Tuesday Thursday and Saturday and states that her weights have been good.  No chest pain.    Past Medical History  Diagnosis Date  . Chronic kidney disease   . GERD (gastroesophageal reflux disease)   . Diabetes mellitus without complication (HCC)   . Peripheral vascular disease (HCC)   . Lymphedema     There are no active problems to display for this patient.   Past Surgical History  Procedure Laterality Date  . Cholecystectomy    . Peripheral vascular catheterization Left 12/06/2014    Procedure: A/V Shuntogram/Fistulagram;  Surgeon: Annice NeedyJason S Dew, MD;  Location: [facility] INVASIVE CV LAB;  Service: Cardiovascular;  Laterality: Left;  . Peripheral vascular catheterization N/A 12/06/2014    Procedure: A/V Shunt Intervention;  Surgeon: Annice NeedyJason S Dew,  MD;  Location: [facility] INVASIVE CV LAB;  Service: Cardiovascular;  Laterality: N/A;    Current Outpatient Rx  Name  Route  Sig  Dispense  Refill  . acetaminophen (TYLENOL) 500 MG tablet   Oral   Take 500 mg by mouth every 6 (six) hours as needed.         Marland Kitchen. albuterol (PROVENTIL) (2.5 MG/3ML) 0.083% nebulizer solution   Nebulization   Take 2.5 mg by nebulization every 6 (six) hours as needed for wheezing or shortness of breath.         . calcium acetate, Phos Binder, (PHOSLYRA) 667 MG/5ML SOLN   Oral   Take 667 mg by mouth 3 (three) times daily with meals. 1 with snacks         . clonazePAM (KLONOPIN) 0.5 MG tablet   Oral   Take 0.5 mg by mouth 2 (two) times daily.         Marland Kitchen. donepezil (ARICEPT) 5 MG tablet   Oral   Take 10 mg by mouth at bedtime.          Marland Kitchen. esomeprazole (NEXIUM) 40 MG capsule   Oral   Take 40 mg by mouth daily at 12 noon.         . gabapentin (NEURONTIN) 100 MG capsule   Oral   Take 100 mg by mouth 2 (two) times daily.         Marland Kitchen. glipiZIDE (GLUCOTROL) 10 MG tablet   Oral   Take 10 mg by mouth 2 (two) times daily.         .Marland Kitchen  memantine (NAMENDA) 5 MG tablet   Oral   Take 5 mg by mouth 2 (two) times daily.         Marland Kitchen PARoxetine (PAXIL) 10 MG tablet   Oral   Take 10 mg by mouth daily.         . pioglitazone (ACTOS) 45 MG tablet   Oral   Take 45 mg by mouth daily.         . pregabalin (LYRICA) 150 MG capsule   Oral   Take 150 mg by mouth daily.          . sevelamer carbonate (RENVELA) 800 MG tablet   Oral   Take 800 mg by mouth 3 (three) times daily with meals. 1 with snacks         . traMADol (ULTRAM) 50 MG tablet   Oral   Take by mouth 2 (two) times daily.         . traZODone (DESYREL) 50 MG tablet   Oral   Take 50 mg by mouth at bedtime.           Allergies Metformin and related; Midodrine; and Levaquin  No family history on file.  Social History Social History  Substance Use Topics  . Smoking status:  Never Smoker   . Smokeless tobacco: Not on file  . Alcohol Use: No     Review of Systems  Constitutional: Negative for fever. Eyes: Negative for visual changes. ENT: Negative for sore throat. Cardiovascular: Negative for chest pain. Respiratory: Negative for shortness of breath and cough. Gastrointestinal: Negative for abdominal pain, vomiting and diarrhea. Genitourinary: Negative for dysuria. Musculoskeletal: Negative for back pain. Skin: Negative for rash. Neurological: Negative for headache. 10 point Review of Systems otherwise negative ____________________________________________   PHYSICAL EXAM:  VITAL SIGNS: ED Triage Vitals  Enc Vitals Group     BP 12/19/14 1559 143/51 mmHg     Pulse Rate 12/19/14 1559 80     Resp 12/19/14 1559 18     Temp 12/19/14 1559 98.4 F (36.9 C)     Temp src --      SpO2 12/19/14 1559 98 %     Weight 12/19/14 1559 200 lb (90.719 kg)     Height 12/19/14 1559  (1.549 m)     Head Cir --      Peak Flow --      Pain Score 12/19/14 1601 0     Pain Loc --      Pain Edu? --      Excl. in GC? --      Constitutional: Alert and oriented. Well appearing and in no distress. Eyes: Conjunctivae are normal. PERRL. Normal extraocular movements. ENT   Head: Normocephalic and atraumatic.   Nose: No congestion/rhinnorhea.   Mouth/Throat: Mucous membranes are moist.   Neck: No stridor. Cardiovascular/Chest: Normal rate, regular rhythm.  No murmurs, rubs, or gallops. Respiratory: Normal respiratory effort without tachypnea nor retractions. Moderate and expiratory wheezing bilaterally and posteriorly. No rhonchi. Gastrointestinal: Soft. No distention, no guarding, no rebound. Nontender   Genitourinary/rectal:Deferred Musculoskeletal: Nontender with normal range of motion in all extremities. No joint effusions.  No lower extremity tenderness.  No edema. Neurologic:  Normal speech and language. No gross or focal neurologic deficits are  appreciated. Skin:  Skin is warm, dry and intact. No rash noted. Psychiatric: Mood and affect are normal. Speech and behavior are normal. Patient exhibits appropriate insight and judgment.  ____________________________________________   EKG I, Governor Rooks, MD,  the attending physician have personally viewed and interpreted all ECGs.  91 bpm.  NSR.  Right bundle branch block. Left axis deviation. Nl st/t wave ____________________________________________  LABS (pertinent positives/negatives)  Basic metabolic panel significant for BUN 53 and creatinine 5.84 Troponin less than 0.03 White blood count 10.1 with slight left shift. Hemoglobin 10.7 and bili count 209   ____________________________________________  RADIOLOGY All Xrays were viewed by me. Imaging interpreted by Radiologist.  Chest x-ray two-view:   IMPRESSION: No active cardiopulmonary disease. __________________________________________  PROCEDURES  Procedure(s) performed: None  Critical Care performed: None  ____________________________________________   ED COURSE / ASSESSMENT AND PLAN  CONSULTATIONS: None  Pertinent labs & imaging results that were available during my care of the patient were reviewed by me and considered in my medical decision making (see chart for details).  I'm unable to access the [facility] clinic physician note, however it sounds like the patient was sent over here for evaluation of wheezing/bronchospasm. It sounds like the patient thought that she would be admitted for failed outpatient treatment of a COPD exacerbation, although patient states she's never been diagnosed with COPD.  On further discussion of the history, it sounds like she has had a lot of bleeding from a about one year now has not been evaluated by a pulmonologist. The most recent episode it sounds like has been going on now for about 2 weeks. She has been taking inhaled steroid twice daily, as well as using an albuterol  nebulizer approximately 2-3 times daily. She is no worse, but she doesn't feel like she is much better. She continues to have some wheezing with shortness of breath as well as some cough. There's been no fever. No chest pain.  She is currently on a 10 day course of  Prednisone, however it appears this is been 20 mg daily.  Her laboratory evaluation is without significant abnormalities. She is a dialysis patient and her next dialysis is scheduled for Tuesday. She has a potassium of 5.3, I will give her dose of Kayexalate.  I discussed with Dr. Cherylann Ratel, nephrology - recommends one dose kayexalate and regular Tuesday dialysis.  Her chest x-ray does not show signs of fluid/pulmonary edema. There is no sign of infiltrate/pneumonia. I do think that she has bronchospasm with bronchitis and possibly COPD, although without a smoking history or diagnosis with the pulmonologist, I will defer to pulmonologist for a diagnosis for what sounds acute on chronic problem over the past 1 year.  I do not find an indication for hospitalization, as the patient is comfortable breathing at rest and with walking, with normal and stable O2 sat at rest and with walking, and without any additional signs of pneumonia or pulmonary edema.  Symptoms are not suggestive of pulmonary embolism. At this point I don't think she needs a CT of her chest. I have talked with the patient and her daughter about the need for follow-up with a pulmonology specialist.  For now the patient's lungs sound much better after DuoNeb treatment. I am going to increase her prednisone dose to 60 mg and then a taper.    Patient / Family / Caregiver informed of clinical course, medical decision-making process, and agree with plan.   I discussed return precautions, follow-up instructions, and discharged instructions with patient and/or family.  ___________________________________________   FINAL CLINICAL IMPRESSION(S) / ED DIAGNOSES   Final diagnoses:   Bronchospasm with bronchitis, acute       Governor Rooks, MD 12/19/14 2145

## 2014-12-19 NOTE — Discharge Instructions (Signed)
You were evaluated for wheezing and cough, and your exam and evaluation are reassuring in the emergency department. Continue on prednisone, but at higher dose and then taper. As we discussed, you need to follow up with a pulmonologist.  Continue albuterol nebulized treatments and you may do this every 4 hours as needed for wheezing and shortness of breath.   Return to the emergency department for any new or worsening condition including trouble breathing, chest pain, fever, confusion or altered mental status, dizziness or passing out.    Bronchospasm, Adult A bronchospasm is when the tubes that carry air in and out of your lungs (airways) spasm or tighten. During a bronchospasm it is hard to breathe. This is because the airways get smaller. A bronchospasm can be triggered by:  Allergies. These may be to animals, pollen, food, or mold.  Infection. This is a common cause of bronchospasm.  Exercise.  Irritants. These include pollution, cigarette smoke, strong odors, aerosol sprays, and paint fumes.  Weather changes.  Stress.  Being emotional. HOME CARE   Always have a plan for getting help. Know when to call your doctor and local emergency services (911 in the [place] Know where you can get emergency care.  Only take medicines as told by your doctor.  If you were prescribed an inhaler or nebulizer machine, ask your doctor how to use it correctly. Always use a spacer with your inhaler if you were given one.  Stay calm during an attack. Try to relax and breathe more slowly.  Control your home environment:  Change your heating and air conditioning filter at least once a month.  Limit your use of fireplaces and wood stoves.  Do not  smoke. Do not  allow smoking in your home.  Avoid perfumes and fragrances.  Get rid of pests (such as roaches and mice) and their droppings.  Throw away plants if you see mold on them.  Keep your house clean and dust free.  Replace carpet with  wood, tile, or vinyl flooring. Carpet can trap dander and dust.  Use allergy-proof pillows, mattress covers, and box spring covers.  Wash bed sheets and blankets every week in hot water. Dry them in a dryer.  Use blankets that are made of polyester or cotton.  Wash hands frequently. GET HELP IF:  You have muscle aches.  You have chest pain.  The thick spit you spit or cough up (sputum) changes from clear or white to yellow, green, gray, or bloody.  The thick spit you spit or cough up gets thicker.  There are problems that may be related to the medicine you are given such as:  A rash.  Itching.  Swelling.  Trouble breathing. GET HELP RIGHT AWAY IF:  You feel you cannot breathe or catch your breath.  You cannot stop coughing.  Your treatment is not helping you breathe better.  You have very bad chest pain. MAKE SURE YOU:   Understand these instructions.  Will watch your condition.  Will get help right away if you are not doing well or get worse.   This information is not intended to replace advice given to you by your health care provider. Make sure you discuss any questions you have with your health care provider.   Document Released: 10/29/2008 Document Revised: 01/22/2014 Document Reviewed: 06/24/2012 Elsevier Interactive Patient Education [employer]2016 Else[employer] Inc.

## 2014-12-27 ENCOUNTER — Other Ambulatory Visit: Payer: Self-pay | Admitting: Specialist

## 2014-12-27 DIAGNOSIS — R059 Cough, unspecified: Secondary | ICD-10-CM

## 2014-12-27 DIAGNOSIS — R05 Cough: Secondary | ICD-10-CM

## 2014-12-31 ENCOUNTER — Ambulatory Visit
Admission: RE | Admit: 2014-12-31 | Discharge: 2014-12-31 | Disposition: A | Discharge status: Home / Self Care | Payer: Medicare Other | Source: Ambulatory Visit | Attending: Specialist | Admitting: Specialist

## 2014-12-31 DIAGNOSIS — R059 Cough, unspecified: Secondary | ICD-10-CM

## 2014-12-31 DIAGNOSIS — R05 Cough: Secondary | ICD-10-CM | POA: Diagnosis not present

## 2015-01-04 ENCOUNTER — Ambulatory Visit: Payer: Medicare Other

## 2015-08-09 ENCOUNTER — Emergency Department: Payer: Medicare Other

## 2015-08-09 ENCOUNTER — Emergency Department
Admission: EM | Admit: 2015-08-09 | Discharge: 2015-08-10 | Disposition: A | Discharge status: Home / Self Care | Payer: Medicare Other | Attending: Emergency Medicine | Admitting: Emergency Medicine

## 2015-08-09 DIAGNOSIS — Z79899 Other long term (current) drug therapy: Secondary | ICD-10-CM | POA: Diagnosis not present

## 2015-08-09 DIAGNOSIS — R4781 Slurred speech: Secondary | ICD-10-CM | POA: Diagnosis not present

## 2015-08-09 DIAGNOSIS — E1151 Type 2 diabetes mellitus with diabetic peripheral angiopathy without gangrene: Secondary | ICD-10-CM | POA: Diagnosis not present

## 2015-08-09 DIAGNOSIS — R531 Weakness: Secondary | ICD-10-CM | POA: Diagnosis present

## 2015-08-09 DIAGNOSIS — E1122 Type 2 diabetes mellitus with diabetic chronic kidney disease: Secondary | ICD-10-CM | POA: Diagnosis not present

## 2015-08-09 DIAGNOSIS — N189 Chronic kidney disease, unspecified: Secondary | ICD-10-CM | POA: Insufficient documentation

## 2015-08-09 LAB — CBC WITH DIFFERENTIAL/PLATELET
BASOS ABS: 0.1 10*3/uL (ref 0–0.1)
Basophils Relative: 1 %
EOS ABS: 0.1 10*3/uL (ref 0–0.7)
EOS PCT: 2 %
HCT: 40.7 % (ref 35.0–47.0)
Hemoglobin: 13.4 g/dL (ref 12.0–16.0)
LYMPHS ABS: 0.9 10*3/uL — AB (ref 1.0–3.6)
Lymphocytes Relative: 12 %
MCH: 32.4 pg (ref 26.0–34.0)
MCHC: 33 g/dL (ref 32.0–36.0)
MCV: 98.4 fL (ref 80.0–100.0)
MONO ABS: 0.7 10*3/uL (ref 0.2–0.9)
Monocytes Relative: 9 %
Neutro Abs: 5.7 10*3/uL (ref 1.4–6.5)
Neutrophils Relative %: 76 %
PLATELETS: 217 10*3/uL (ref 150–440)
RBC: 4.13 MIL/uL (ref 3.80–5.20)
RDW: 16.2 % — AB (ref 11.5–14.5)
WBC: 7.5 10*3/uL (ref 3.6–11.0)

## 2015-08-09 LAB — TROPONIN I: Troponin I: <0.03 ng/mL (ref <0.03)

## 2015-08-09 LAB — MAGNESIUM: MAGNESIUM: 2.1 mg/dL (ref 1.7–2.4)

## 2015-08-09 LAB — COMPREHENSIVE METABOLIC PANEL
ALT: 10 U/L — AB (ref 14–54)
AST: 19 U/L (ref 15–41)
Albumin: 4 g/dL (ref 3.5–5.0)
Alkaline Phosphatase: 102 U/L (ref 38–126)
Anion gap: 11 (ref 5–15)
BILIRUBIN TOTAL: 0.7 mg/dL (ref 0.3–1.2)
BUN: 26 mg/dL — AB (ref 6–20)
CO2: 29 mmol/L (ref 22–32)
CREATININE: 5.28 mg/dL — AB (ref 0.44–1.00)
Calcium: 8.7 mg/dL — ABNORMAL LOW (ref 8.9–10.3)
Chloride: 95 mmol/L — ABNORMAL LOW (ref 101–111)
GFR calc Af Amer: 8 mL/min — ABNORMAL LOW (ref >60)
GFR, EST NON AFRICAN AMERICAN: 7 mL/min — AB (ref >60)
Glucose, Bld: 200 mg/dL — ABNORMAL HIGH (ref 65–99)
Potassium: 4.4 mmol/L (ref 3.5–5.1)
Sodium: 135 mmol/L (ref 135–145)
TOTAL PROTEIN: 7.6 g/dL (ref 6.5–8.1)

## 2015-08-09 NOTE — ED Provider Notes (Signed)
[facility] Emergency Department Provider Note  ____________________________________________  Time seen: Approximately 10:24 PM  I have reviewed the triage vital signs and the nursing notes.   HISTORY  Chief Complaint Weakness    HPI Renee Daugherty is a 82 y.o. female with a past medical history that includes chronic kidney disease on hemodialysis for many years, diabetes, chronic lymphedema, peripheral vascular disease who presents by private vehicle with her family with concerns for acute left leg pain as well as severe generalized weakness and a complete ability to ambulate.  The report that she has been getting worse over an extended period of time for about a month now with increased tremor and she has seen her neurologist, Dr. Malvin Johns, multiple times.  However she is typically able to ambulate with the assistance of a cane.  She has been increasingly weak over the last several days and then as of today she is completely unable to walk or even to try to lift herself up out of bed.  She required full care by multiple family members to get her to the car and required the assistance of multiple ED staff members to get her inside.  No history of trauma of which they are aware.  She has some resting baseline tremors of her extremities as well as of her mouth and face but that seems to be getting gradually worse over time.She denies chest pain, shortness of breath, abdominal pain, nausea, vomiting.  She does not make urine and has not done so for years.  She has had a mild cough recently.  Her family describes her symptoms as severe and they are completely unable to care for her at home at this time.  She describes the pain in the posterior left thigh as acute, moderate, not worsened or improved by anything in particular, and sharp and stabbing.  She has no history of blood clots in the legs or the lungs.  Past Medical History:  Diagnosis Date  . Chronic kidney disease    . Diabetes mellitus without complication (HCC)   . GERD (gastroesophageal reflux disease)   . Lymphedema   . Peripheral vascular disease (HCC)     There are no active problems to display for this patient.   Past Surgical History:  Procedure Laterality Date  . CHOLECYSTECTOMY    . PERIPHERAL VASCULAR CATHETERIZATION Left 12/06/2014   Procedure: A/V Shuntogram/Fistulagram;  Surgeon: Annice Needy, MD;  Location: [facility] INVASIVE CV LAB;  Service: Cardiovascular;  Laterality: Left;  . PERIPHERAL VASCULAR CATHETERIZATION N/A 12/06/2014   Procedure: A/V Shunt Intervention;  Surgeon: Annice Needy, MD;  Location: [facility] INVASIVE CV LAB;  Service: Cardiovascular;  Laterality: N/A;    Prior to Admission medications   Medication Sig Start Date End Date Taking? Authorizing Provider  albuterol (PROVENTIL) (2.5 MG/3ML) 0.083% nebulizer solution Take 2.5 mg by nebulization every 6 (six) hours as needed for wheezing or shortness of breath.   Yes Historical Provider, MD  budesonide-formoterol (SYMBICORT) 160-4.5 MCG/ACT inhaler Inhale 2 puffs into the lungs 2 (two) times daily.   Yes Historical Provider, MD  calcium acetate, Phos Binder, (PHOSLYRA) 667 MG/5ML SOLN Take 667 mg by mouth 3 (three) times daily with meals. 1 with snacks   Yes Historical Provider, MD  carbidopa-levodopa (SINEMET IR) 25-100 MG tablet Take 1 tablet by mouth 3 (three) times daily.   Yes Historical Provider, MD  cinacalcet (SENSIPAR) 30 MG tablet Take 30 mg by mouth daily.   Yes Historical  Provider, MD  DiphenhydrAMINE HCl (EQ ALLERGY RELIEF PO) Take 1 tablet by mouth every morning.   Yes Historical Provider, MD  donepezil (ARICEPT) 5 MG tablet Take 10 mg by mouth at bedtime.    Yes Historical Provider, MD  esomeprazole (NEXIUM) 40 MG capsule Take 40 mg by mouth daily at 12 noon.   Yes Historical Provider, MD  gabapentin (NEURONTIN) 100 MG capsule Take 100 mg by mouth 2 (two) times daily.   Yes Historical Provider, MD  glipiZIDE  (GLUCOTROL) 10 MG tablet Take 10 mg by mouth 2 (two) times daily.   Yes Historical Provider, MD  memantine (NAMENDA) 5 MG tablet Take 5 mg by mouth 2 (two) times daily.   Yes Historical Provider, MD  PARoxetine (PAXIL) 10 MG tablet Take 10 mg by mouth daily.   Yes Historical Provider, MD  pregabalin (LYRICA) 150 MG capsule Take 150 mg by mouth daily.    Yes Historical Provider, MD  sevelamer carbonate (RENVELA) 800 MG tablet Take 800 mg by mouth 3 (three) times daily with meals. 1 with snacks   Yes Historical Provider, MD  traZODone (DESYREL) 50 MG tablet Take 50 mg by mouth at bedtime.   Yes Historical Provider, MD  acetaminophen (TYLENOL) 500 MG tablet Take 500 mg by mouth every 6 (six) hours as needed.    Historical Provider, MD     Allergies Metformin and related; Midodrine; and Levaquin [levofloxacin in d5w]  No family history on file.  Social History Social History  Substance Use Topics  . Smoking status: Never Smoker  . Smokeless tobacco: Not on file  . Alcohol use No    Review of Systems Constitutional: No fever/chills.  Gen. malaise, fatigue, and weakness Eyes: No visual changes. ENT: No sore throat. Cardiovascular: Denies chest pain. Respiratory: Denies shortness of breath. Gastrointestinal: No abdominal pain.  No nausea, no vomiting.  No diarrhea.  No constipation. Genitourinary: Negative for dysuria. Musculoskeletal: Negative for back pain.  Pain in the back of her left thigh that is new as of today Skin: Negative for rash. Neurological: Severe generalized weakness and an inability to ambulate or even help herself up.  It focal neurological deficits.  10-point ROS otherwise negative.  ____________________________________________   PHYSICAL EXAM:  VITAL SIGNS: ED Triage Vitals  Enc Vitals Group     BP 08/09/15 2005 (!) 131/33     Pulse Rate 08/09/15 2005 84     Resp 08/09/15 2005 20     Temp 08/09/15 2005 100.3 F (37.9 C)     Temp Source 08/09/15 2005  Oral     SpO2 08/09/15 2005 95 %     Weight 08/09/15 2005 209 lb (94.8 kg)     Height --      Head Circumference --      Peak Flow --      Pain Score 08/09/15 2006 6     Pain Loc --      Pain Edu? --      Excl. in GC? --     Constitutional: Alert and oriented. Well appearing and in no acute distress. Eyes: Conjunctivae are normal. PERRL. EOMI. Head: Atraumatic. Nose: No congestion/rhinnorhea. Mouth/Throat: Mucous membranes are moist.  Oropharynx non-erythematous. Neck: No stridor.  No meningeal signs.   Cardiovascular: Normal rate, regular rhythm. Good peripheral circulation. Grossly normal heart sounds.   Respiratory: Normal respiratory effort.  No retractions. Lungs CTAB. Gastrointestinal: Obesity.  Soft and nontender. No distention.  Musculoskeletal: Chronic lower extremity swelling bilaterally.  Mild tenderness to palpation of the posterior left thigh, vascular system exam limited by habitus. Neurologic:  Normal speech and language. No gross focal neurologic deficits are appreciated; patient is able to resist against gravity with all 4 extremities but is completely unable to lift herself up or support any weight Skin:  Skin is warm, dry and intact. No rash noted.  ____________________________________________   LABS (all labs ordered are listed, but only abnormal results are displayed)  Labs Reviewed  COMPREHENSIVE METABOLIC PANEL - Abnormal; Notable for the following:       Result Value   Chloride 95 (*)    Glucose, Bld 200 (*)    BUN 26 (*)    Creatinine, Ser 5.28 (*)    Calcium 8.7 (*)    ALT 10 (*)    GFR calc non Af Amer 7 (*)    GFR calc Af Amer 8 (*)    All other components within normal limits  CBC WITH DIFFERENTIAL/PLATELET - Abnormal; Notable for the following:    RDW 16.2 (*)    Lymphs Abs 0.9 (*)    All other components within normal limits  MAGNESIUM  TROPONIN I   ____________________________________________  EKG  ED ECG REPORT I, Trai Ells,  the attending physician, personally viewed and interpreted this ECG.  Date: 08/09/2015 EKG Time: 21:05 Rate: 78 Rhythm: normal sinus rhythm QRS Axis: normal Intervals: Right bundle branch block ST/T Wave abnormalities: normal Conduction Disturbances: none Narrative Interpretation: Non-specific ST segment / T-wave changes, but no evidence of acute ischemia.   ____________________________________________  RADIOLOGY   Ct Head Wo Contrast  Result Date: 08/09/2015 CLINICAL DATA:  82 year old female movement weakness and slurred speech. EXAM: CT HEAD WITHOUT CONTRAST TECHNIQUE: Contiguous axial images were obtained from the base of the skull through the vertex without intravenous contrast. COMPARISON:  None. FINDINGS: There is mild age-related atrophy and chronic microvascular ischemic changes. There is no acute intracranial hemorrhage. No mass effect or midline shift noted. The visualized paranasal sinuses and mastoid air cells are clear. The calvarium is intact. IMPRESSION: No acute intracranial hemorrhage. Mild age-related atrophy and chronic microvascular ischemic disease. If symptoms persist and there are no contraindications, MRI may provide better evaluation if clinically indicated Electronically Signed   By: Elgie Collard M.D.   On: 08/09/2015 22:18   ____________________________________________   PROCEDURES  Procedure(s) performed:   Procedures   ____________________________________________   INITIAL IMPRESSION / ASSESSMENT AND PLAN / ED COURSE  Pertinent labs & imaging results that were available during my care of the patient were reviewed by me and considered in my medical decision making (see chart for details).  I had an extensive conversation with the family about how at this point she does not have any diagnoses that are admittable as an inpatient.  However she is unable to walk and the family is unable to care for her.  I am obtaining an ultrasound of her left  lower extremity to rule out DVT and a chest x-ray to make sure that the recent cough does not represent a pneumonia although this is a low probability.  We talked about the necessity for placement in a skilled nursing facility and as of right now, unless a new acute medical diagnosis is identified, the patient will need social work consult, PT consult, and likely placement in a skilled nursing facility directly from the emergency department.  The patient's family understands and agrees with this plan.  I will go through her medications in order the  appropriate medications while she is here as well as ordering a hospital bed.  Transferring ED care to Dr. Scotty Court to follow-up chest x-ray and ultrasound.  Clinical Course    ____________________________________________  FINAL CLINICAL IMPRESSION(S) / ED DIAGNOSES  Final diagnoses:  None     MEDICATIONS GIVEN DURING THIS VISIT:  Medications - No data to display   NEW OUTPATIENT MEDICATIONS STARTED DURING THIS VISIT:  New Prescriptions   No medications on file      Note:  This document was prepared using Dragon voice recognition software and may include unintentional dictation errors.    Loleta Rose, MD 08/09/15 2358

## 2015-08-09 NOTE — ED Triage Notes (Signed)
Pt in with co generalized weakness, altered mental status since yest, increased shaking, and cough.

## 2015-08-09 NOTE — ED Notes (Signed)
Patient transported to CT 

## 2015-08-09 NOTE — ED Notes (Signed)
Patient transported to Ultrasound 

## 2015-08-10 ENCOUNTER — Emergency Department: Payer: Medicare Other

## 2015-08-10 MED ORDER — PAROXETINE HCL 10 MG PO TABS
10.0000 mg | ORAL_TABLET | Freq: Every day | ORAL | Status: DC
  Administered 2015-08-10: 10 mg via ORAL
  Filled 2015-08-10: qty 1

## 2015-08-10 MED ORDER — ACETAMINOPHEN 500 MG PO TABS
500.0000 mg | ORAL_TABLET | Freq: Four times a day (QID) | ORAL | Status: DC | PRN
Start: 2015-08-10 — End: 2015-08-11
  Administered 2015-08-10: 500 mg via ORAL
  Filled 2015-08-10: qty 1

## 2015-08-10 MED ORDER — LORATADINE 10 MG PO TABS
10.0000 mg | ORAL_TABLET | ORAL | Status: DC
  Administered 2015-08-10: 10 mg via ORAL
  Filled 2015-08-10: qty 1

## 2015-08-10 MED ORDER — MOMETASONE FURO-FORMOTEROL FUM 200-5 MCG/ACT IN AERO
2.0000 | INHALATION_SPRAY | Freq: Two times a day (BID) | RESPIRATORY_TRACT | Status: DC
  Administered 2015-08-10: 2 via RESPIRATORY_TRACT
  Filled 2015-08-10: qty 8.8

## 2015-08-10 MED ORDER — CARBIDOPA-LEVODOPA 25-100 MG PO TABS
1.0000 | ORAL_TABLET | Freq: Three times a day (TID) | ORAL | Status: DC
Start: 2015-08-10 — End: 2015-08-11
  Administered 2015-08-10 (×2): 1 via ORAL
  Filled 2015-08-10 (×5): qty 1

## 2015-08-10 MED ORDER — ALBUTEROL SULFATE (2.5 MG/3ML) 0.083% IN NEBU: 2.5000 mg | INHALATION_SOLUTION | Freq: Four times a day (QID) | RESPIRATORY_TRACT | Status: DC | PRN

## 2015-08-10 MED ORDER — GABAPENTIN 100 MG PO CAPS
100.0000 mg | ORAL_CAPSULE | Freq: Two times a day (BID) | ORAL | Status: DC
  Administered 2015-08-10 (×2): 100 mg via ORAL
  Filled 2015-08-10 (×3): qty 1

## 2015-08-10 MED ORDER — MEMANTINE HCL 5 MG PO TABS
5.0000 mg | ORAL_TABLET | Freq: Two times a day (BID) | ORAL | Status: DC
  Administered 2015-08-10 (×2): 5 mg via ORAL
  Filled 2015-08-10 (×2): qty 1

## 2015-08-10 MED ORDER — GLIPIZIDE 10 MG PO TABS
10.0000 mg | ORAL_TABLET | Freq: Two times a day (BID) | ORAL | Status: DC
  Administered 2015-08-10: 10 mg via ORAL
  Filled 2015-08-10 (×4): qty 1

## 2015-08-10 MED ORDER — CALCIUM ACETATE (PHOS BINDER) 667 MG/5ML PO SOLN
667.0000 mg | Freq: Three times a day (TID) | ORAL | Status: DC
  Administered 2015-08-10 (×3): 667 mg via ORAL
  Filled 2015-08-10 (×6): qty 5

## 2015-08-10 MED ORDER — SEVELAMER CARBONATE 800 MG PO TABS
800.0000 mg | ORAL_TABLET | Freq: Three times a day (TID) | ORAL | Status: DC
  Administered 2015-08-10 (×3): 800 mg via ORAL
  Filled 2015-08-10 (×5): qty 1

## 2015-08-10 MED ORDER — DONEPEZIL HCL 5 MG PO TABS
10.0000 mg | ORAL_TABLET | Freq: Every day | ORAL | Status: DC
  Administered 2015-08-10: 10 mg via ORAL
  Filled 2015-08-10: qty 2

## 2015-08-10 MED ORDER — PANTOPRAZOLE SODIUM 40 MG PO TBEC
40.0000 mg | DELAYED_RELEASE_TABLET | Freq: Every day | ORAL | Status: DC
  Administered 2015-08-10: 40 mg via ORAL
  Filled 2015-08-10: qty 1

## 2015-08-10 MED ORDER — CINACALCET HCL 30 MG PO TABS
30.0000 mg | ORAL_TABLET | Freq: Every day | ORAL | Status: DC
  Administered 2015-08-10: 30 mg via ORAL
  Filled 2015-08-10: qty 1

## 2015-08-10 MED ORDER — PREGABALIN 75 MG PO CAPS
150.0000 mg | ORAL_CAPSULE | Freq: Every day | ORAL | Status: DC
  Administered 2015-08-10: 150 mg via ORAL
  Filled 2015-08-10: qty 2

## 2015-08-10 MED ORDER — TRAZODONE HCL 50 MG PO TABS
50.0000 mg | ORAL_TABLET | Freq: Every day | ORAL | Status: DC
  Administered 2015-08-10: 50 mg via ORAL
  Filled 2015-08-10: qty 1

## 2015-08-10 NOTE — ED Notes (Signed)
Pt given lunch tray, sitting on side of bed eating, family at bedside

## 2015-08-10 NOTE — ED Notes (Addendum)
Pt. Transported from [facility] ED to [facility] by [employer]. Called to update facility about pt. Arrival.

## 2015-08-10 NOTE — Progress Notes (Signed)
Inpatient Diabetes Program Recommendations  AACE/ADA: New Consensus Statement on Inpatient Glycemic Control (2015)  Target Ranges:  Prepandial:   less than 140 mg/dL      Peak postprandial:   less than 180 mg/dL (1-2 hours)      Critically ill patients:  140 - 180 mg/dL   Results for Renee Daugherty, Renee Daugherty (MRN [id number]) as of 08/10/2015 09:27  Ref. Range 08/09/2015 21:11  Glucose Latest Ref Range: 65 - 99 mg/dL 338 (H)   Review of Glycemic Control  Diabetes history: DM2 Outpatient Diabetes medications: Glipizide 10 mg BID Current orders for Inpatient glycemic control: Glipizide 10 mg BID  Inpatient Diabetes Program Recommendations: Correction (SSI): While holding in the ED, please consider using the Glycemic Control order set to order CBGs with Novolog sensitive correction scale ACHS. Diet: Please consider changing diet to Carb Modified diet.  Thanks, Orlando Penner, RN, MSN, CDE Diabetes Coordinator Inpatient Diabetes Program [phone] (Team Pager from 8am to 5pm) [phone] ([place] office) [phone] [facility] office) [phone] [facility] office)

## 2015-08-10 NOTE — Clinical Social Work Note (Signed)
Clinical Social Work Assessment  Patient Details  Name: Renee Daugherty MRN: [id number] Date of Birth: 1926/01/18  Date of referral:  08/10/15               Reason for consult:  Facility Placement                Permission sought to share information with:  Family Supports Permission granted to share information::  Yes, Verbal Permission Granted  Name::     Everett Graff (daughter) [phone]  Agency::     Relationship::     Contact Information:     Housing/Transportation Living arrangements for the past 2 months:  Single Family Home Source of Information:  Patient, Adult Children Patient Interpreter Needed:  None Criminal Activity/Legal Involvement Pertinent to Current Situation/Hospitalization:  No - Comment as needed Significant Relationships:  Adult Children Lives with:  Self Do you feel safe going back to the place where you live?  Yes Need for family participation in patient care:  Yes (Comment)  Care giving concerns: Pt lives alone and is unable to care for herself at this time.   Social Worker assessment / plan: CSW received a consult for placement concerns. Pt lives alone and has been getting help with care from her daughter, who is now unable to care for her alone. CSW engaged with pt at her bedside with pt's daughter present. CSW introduced herself and her role as a Child psychotherapist. Pt states that she believes she can care for herself alone and would like to return to her home. Pt's daughter is concerned that pt has been dependent with mobility for the past few days and will need too much help at home.  CSW provided emotional support and allowed the pt and pt's daughter to each express their concerns. CSW informed pt and pt's daughter that the decision to go to a facility would be up to the pt and that the CSW could not move forward without the pt's consent. Pt is agreeable to CSW sending referrals, but would like to wait for PT's recommendations before committing to a facility.  CSW will send out FL2 and present bed offers to the family. Pending PT evaluation, CSW will discuss with the family how to move forward. CSW will continue to follow pt and assist as needed.  Employment status:  Retired Database administrator PT Recommendations:  Not assessed at this time Information / Referral to community resources:  Skilled Nursing Facility  Patient/Family's Response to care: Pending PT's recommendations pt with either return home, or to a Doctor, hospital.  Patient/Family's Understanding of and Emotional Response to Diagnosis, Current Treatment, and Prognosis: Pt and pt's family are appreciative of the assistance CSW has provided at this time.  Emotional Assessment Appearance:  Appears stated age Attitude/Demeanor/Rapport:  Other (Cooperative) Affect (typically observed):  Apprehensive, Stoic Orientation:  Oriented to Self, Oriented to Place, Oriented to  Time, Oriented to Situation Alcohol / Substance use:  Not Applicable Psych involvement (Current and /or in the community):  No (Comment)  Discharge Needs  Concerns to be addressed:  Discharge Planning Concerns Readmission within the last 30 days:    Current discharge risk:  Dependent with Mobility Barriers to Discharge:  No Barriers Identified   Jonathon Jordan, LCSWA 08/10/2015, 10:17 AM

## 2015-08-10 NOTE — ED Notes (Signed)
Pt given breakfast tray, resting in bed, family at bedisde

## 2015-08-10 NOTE — Evaluation (Signed)
Physical Therapy Evaluation Patient Details Name: Renee Daugherty MRN: [id number] DOB: 10-15-1926 Today's Date: 08/10/2015   History of Present Illness  82 yo F presented to ED c/o generalized weakness, AMS, increased shaking and cough. She was not given a diagnosis at this time. PMH includes CKD (HD x7 years), DM, lymphedema and PVD.  Clinical Impression  Pt demonstrated generalized weakness and difficulty walking during evaluation. She required mod to min A for bed mobility. Min A required for transfers and ambulation with FWW of limited distances. Frequent cues required for safety during mobility. STR is recommended after hospital discharge to address deficits of strength, endurance, gait, and balance to increase functional independence prior to returning home alone. Pt will benefit from skilled PT services to increase functional I and mobility for safe discharge.     Follow Up Recommendations SNF    Equipment Recommendations  Rolling walker with 5" wheels (rollator too unsteady at this time)    Recommendations for Other Services       Precautions / Restrictions Precautions Precautions: Fall Restrictions Weight Bearing Restrictions: No      Mobility  Bed Mobility Overal bed mobility: Needs Assistance Bed Mobility: Supine to Sit;Sit to Supine     Supine to sit: Mod assist;HOB elevated Sit to supine: Min assist;HOB elevated   General bed mobility comments: assistance for getting trunk upright and lifting LEs into bed  Transfers Overall transfer level: Needs assistance Equipment used: Rolling walker (2 wheeled) Transfers: Sit to/from [employer] Sit to Stand: Mod assist;+2 safety/equipment Stand pivot transfers: Min assist;+2 safety/equipment       General transfer comment: cues for safety, hand placement, staying close to [facility]  Ambulation/Gait Ambulation/Gait assistance: Min assist;+2 safety/equipment Ambulation Distance (Feet): 75 Feet Assistive  device: Rolling walker (2 wheeled) Gait Pattern/deviations: Decreased stride length;Trunk flexed;Narrow base of support Gait velocity: reduced Gait velocity interpretation: Below normal speed for age/gender General Gait Details: Slow, but generally steady gait. Cues for staying close to FWW, keeping head up and postural correction.  Stairs            Wheelchair Mobility    Modified Rankin (Stroke Patients Only)       Balance Overall balance assessment: Needs assistance Sitting-balance support: Bilateral upper extremity supported;Feet supported Sitting balance-Leahy Scale: Good     Standing balance support: Bilateral upper extremity supported Standing balance-Leahy Scale: Fair Standing balance comment: maintains static balance                             Pertinent Vitals/Pain Pain Assessment: No/denies pain    Home Living Family/patient expects to be discharged to:: Private residence Living Arrangements: Alone Available Help at Discharge: Family;Available PRN/intermittently Type of Home: House Home Access: Stairs to enter Entrance Stairs-Rails: Right Entrance Stairs-Number of Steps: 3 Home Layout: One level Home Equipment: Cane - single point;Walker - 4 wheels      Prior Function Level of Independence: Independent with assistive device(s)         Comments: Household ambulation with SPC and most recently rollator. I with basic ADLs, assistance from family for transportation.      Hand Dominance        Extremity/Trunk Assessment   Upper Extremity Assessment: Generalized weakness           Lower Extremity Assessment: Generalized weakness (grossly 3+/5)         Communication   Communication: No difficulties  Cognition Arousal/Alertness: Awake/alert Behavior During  Therapy: WFL for tasks assessed/performed;Flat affect Overall Cognitive Status: Within Functional Limits for tasks assessed                      General  Comments General comments (skin integrity, edema, etc.): sore on R shin, B LE edema    Exercises Other Exercises Other Exercises: Pt ambulated 75 ft, 50 ft and 65 ft with FWW and min A +2 for safety/equipment. Slow, but generally steady gait with no LOB or buckling. Mod cues for staying close to FWW, postural correction and keeping her head up for safety and navigation. Standing therapeutic rest breaks for energy conservation.      Assessment/Plan    PT Assessment Patient needs continued PT services  PT Diagnosis Difficulty walking;Generalized weakness   PT Problem List Decreased strength;Decreased activity tolerance;Decreased balance;Decreased mobility;Decreased safety awareness;Decreased skin integrity  PT Treatment Interventions DME instruction;Gait training;Stair training;Therapeutic activities;Therapeutic exercise;Balance training;Neuromuscular re-education;Patient/family education   PT Goals (Current goals can be found in the Care Plan section) Acute Rehab PT Goals Patient Stated Goal: to get stronger PT Goal Formulation: With patient/family Time For Goal Achievement: 08/24/15 Potential to Achieve Goals: Fair    Frequency Min 2X/week   Barriers to discharge Inaccessible home environment;Decreased caregiver support lives alone, steps to enter    Co-evaluation               End of Session Equipment Utilized During Treatment: Gait belt Activity Tolerance: Patient tolerated treatment well Patient left: in bed;with call bell/phone within reach;with family/visitor present Nurse Communication: Mobility status    Functional Assessment Tool Used: clinical judgement, gait speed Functional Limitation: Mobility: Walking and moving around Mobility: Walking and Moving Around Current Status [phone]): At least 20 percent but less than 40 percent impaired, limited or restricted Mobility: Walking and Moving Around Goal Status [phone]): At least 1 percent but less than 20 percent  impaired, limited or restricted    Time: 1350-1414 PT Time Calculation (min) (ACUTE ONLY): 24 min   Charges:   PT Evaluation $PT Eval Moderate Complexity: 1 Procedure PT Treatments $Gait Training: 8-22 mins   PT G Codes:   PT G-Codes **NOT FOR INPATIENT CLASS** Functional Assessment Tool Used: clinical judgement, gait speed Functional Limitation: Mobility: Walking and moving around Mobility: Walking and Moving Around Current Status (V6160): At least 20 percent but less than 40 percent impaired, limited or restricted Mobility: Walking and Moving Around Goal Status [phone]): At least 1 percent but less than 20 percent impaired, limited or restricted   Adelene Idler, PT, DPT  08/10/15, 2:42 PM [phone]

## 2015-08-10 NOTE — ED Notes (Signed)
Pt awake and alert, daughter at bedside, pt given coffee and apple juice

## 2015-08-10 NOTE — ED Notes (Signed)
EMS here to transport to [facility]

## 2015-08-10 NOTE — ED Notes (Signed)
Breakfast tray given to patient.

## 2015-08-10 NOTE — Discharge Instructions (Signed)
Please seek medical attention for any high fevers, chest pain, shortness of breath, change in behavior, persistent vomiting, bloody stool or any other new or concerning symptoms.  

## 2015-08-10 NOTE — ED Notes (Signed)
Pt sleeping on stretcher, when pt wakes will move pt to in-pt bed. Pt covered from toes to chest in blankets. Rise and fall of chest noted, lights dimmed.

## 2015-08-10 NOTE — ED Notes (Signed)
Pt resting in bed, eyes closed, resp even and unlabored

## 2015-08-10 NOTE — ED Notes (Signed)
dietery called for breakfast tray

## 2015-08-10 NOTE — Progress Notes (Signed)
CSW was informed that pt has been medically cleared for d/c. Pt will be transferred via EMS to [facility]. Facility needs scripts for narcotic medications. Report can be called to [phone] with [address] nurse. RN notified.  Jonathon Jordan, MSW, Theresia Majors [phone]

## 2015-08-10 NOTE — ED Notes (Signed)
Pt resting in bed, eyes closed, resp even and unlabored, daughter at bedside 

## 2015-08-10 NOTE — ED Notes (Signed)
Report given to [facility] at [facility]

## 2015-08-10 NOTE — Clinical Social Work Placement (Signed)
   CLINICAL SOCIAL WORK PLACEMENT  NOTE  Date:  08/10/2015  Patient Details  Name: Renee Daugherty MRN: [id number] Date of Birth: Aug 02, 1926  Clinical Social Work is seeking post-discharge placement for this patient at the Skilled  Nursing Facility level of care (*CSW will initial, date and re-position this form in  chart as items are completed):  Yes   Patient/family provided with [facility] Clinical Social Work Department's list of facilities offering this level of care within the geographic area requested by the patient (or if unable, by the patient's family).  Yes   Patient/family informed of their freedom to choose among providers that offer the needed level of care, that participate in Medicare, Medicaid or managed care program needed by the patient, have an available bed and are willing to accept the patient.  Yes   Patient/family informed of [facility]'s ownership interest in [facility] and [facility], as well as of the fact that they are under no obligation to receive care at these facilities.  PASRR submitted to EDS on 08/10/15     PASRR number received on 08/10/15     Existing PASRR number confirmed on       FL2 transmitted to all facilities in geographic area requested by pt/family on 08/10/15     FL2 transmitted to all facilities within larger geographic area on       Patient informed that his/her managed care company has contracts with or will negotiate with certain facilities, including the following:   Environmental [employer], [facility], [facility], [employer])     Yes   Patient/family informed of bed offers received.  Patient chooses bed at [facility]     Physician recommends and patient chooses bed at      Patient to be transferred to [facility] on 08/10/15.  Patient to be transferred to facility by [facility]     Patient family notified on 08/10/15 of transfer.  Name of family member  notified:  Everett Graff (daughter) [phone]     PHYSICIAN Please prepare priority discharge summary, including medications, Please prepare prescriptions (Scrpits are needed for narcotic medications.)     Additional Comment: 08/10/15 Vito Backers. Beverely Pace, MSW, LCSWA 08/10/15   _______________________________________________ Jonathon Jordan, LCSWA 08/10/2015, 4:36 PM

## 2015-08-10 NOTE — ED Notes (Signed)
Pt. Verbalizes understanding of d/c instructions. VS stable.  Pt. In NAD at time of d/c and denies concerns. Pt. Transported out of the unit by [facility] to [facility]. Family following pt. To facility.

## 2015-08-10 NOTE — ED Notes (Signed)
EMS called for transport to [facility]

## 2015-08-10 NOTE — ED Notes (Signed)
Pt lying on stretcher, blanket covering her. Noticed rise and fall of chest, no extra effort in breathing noted. Lights dimmed for comfort.   Dulera inhaler and Glipizide medication placed in pt specific box in pyxis.

## 2015-08-10 NOTE — NC FL2 (Signed)
[place] MEDICAID FL2 LEVEL OF CARE SCREENING TOOL     IDENTIFICATION  Patient Name: Renee Daugherty Birthdate: 09/28/1926 Sex: female Admission Date (Current Location): 08/09/2015  [place] and [place] Number:  Chiropodist and Address:  [facility], [address], [place], [place] [address]      Provider Number: [id number]  Attending Physician Name and Address:  Phineas Semen, MD  Relative Name and Phone Number:       Current Level of Care: Hospital Recommended Level of Care: Skilled Nursing Facility Prior Approval Number:    Date Approved/Denied:   PASRR Number: [id number]  Discharge Plan: SNF    Current Diagnoses: There are no active problems to display for this patient.   Orientation RESPIRATION BLADDER Height & Weight     Self, Time, Situation, Place  Normal Continent Weight: 209 lb (94.8 kg) Height:     BEHAVIORAL SYMPTOMS/MOOD NEUROLOGICAL BOWEL NUTRITION STATUS      Continent Diet (Regular, thin fluid consistency)  AMBULATORY STATUS COMMUNICATION OF NEEDS Skin   Extensive Assist Verbally Normal                       Personal Care Assistance Level of Assistance  Bathing, Feeding, Dressing Bathing Assistance: Limited assistance Feeding assistance: Independent Dressing Assistance: Limited assistance     Functional Limitations Info  Sight, Hearing, Speech Sight Info: Adequate Hearing Info: Adequate Speech Info: Impaired (Difficult to understand at times)    SPECIAL CARE FACTORS FREQUENCY  PT (By licensed PT), OT (By licensed OT)     PT Frequency: 5 OT Frequency: 5            Contractures      Additional Factors Info  Code Status, Allergies Code Status Info: Full Code Allergies Info: Tape, Levaquin Levofloxacin In D5w           Current Medications (08/10/2015):  This is the current hospital active medication list Current Facility-Administered Medications  Medication Dose Route Frequency  Provider Last Rate Last Dose  . acetaminophen (TYLENOL) tablet 500 mg  500 mg Oral Q6H PRN Loleta Rose, MD   500 mg at 08/10/15 0028  . albuterol (PROVENTIL) (2.5 MG/3ML) 0.083% nebulizer solution 2.5 mg  2.5 mg Nebulization Q6H PRN Loleta Rose, MD      . calcium acetate (Phos Binder) (PHOSLYRA) 667 MG/5ML oral solution 667 mg  667 mg Oral TID WC Loleta Rose, MD   667 mg at 08/10/15 0855  . carbidopa-levodopa (SINEMET IR) 25-100 MG per tablet immediate release 1 tablet  1 tablet Oral TID Loleta Rose, MD   1 tablet at 08/10/15 1010  . cinacalcet (SENSIPAR) tablet 30 mg  30 mg Oral Daily Loleta Rose, MD   30 mg at 08/10/15 1010  . donepezil (ARICEPT) tablet 10 mg  10 mg Oral QHS Loleta Rose, MD   10 mg at 08/10/15 0027  . gabapentin (NEURONTIN) capsule 100 mg  100 mg Oral BID Loleta Rose, MD   100 mg at 08/10/15 1010  . glipiZIDE (GLUCOTROL) tablet 10 mg  10 mg Oral BID Loleta Rose, MD   10 mg at 08/10/15 1011  . loratadine (CLARITIN) tablet 10 mg  10 mg Oral Lesle Reek Loleta Rose, MD   10 mg at 08/10/15 0853  . memantine (NAMENDA) tablet 5 mg  5 mg Oral BID Loleta Rose, MD   5 mg at 08/10/15 1009  . mometasone-formoterol (DULERA) 200-5 MCG/ACT inhaler 2 puff  2 puff  Inhalation BID Loleta Rose, MD   2 puff at 08/10/15 [phone]  . pantoprazole (PROTONIX) EC tablet 40 mg  40 mg Oral Daily Loleta Rose, MD   40 mg at 08/10/15 1009  . PARoxetine (PAXIL) tablet 10 mg  10 mg Oral Daily Loleta Rose, MD   10 mg at 08/10/15 1010  . pregabalin (LYRICA) capsule 150 mg  150 mg Oral Daily Loleta Rose, MD   150 mg at 08/10/15 1009  . sevelamer carbonate (RENVELA) tablet 800 mg  800 mg Oral TID WC Loleta Rose, MD   800 mg at 08/10/15 0854  . traZODone (DESYREL) tablet 50 mg  50 mg Oral QHS Loleta Rose, MD   50 mg at 08/10/15 0028   Current Outpatient Prescriptions  Medication Sig Dispense Refill  . albuterol (PROVENTIL) (2.5 MG/3ML) 0.083% nebulizer solution Take 2.5 mg by nebulization every 6 (six) hours  as needed for wheezing or shortness of breath.    . budesonide-formoterol (SYMBICORT) 160-4.5 MCG/ACT inhaler Inhale 2 puffs into the lungs 2 (two) times daily.    . calcium acetate, Phos Binder, (PHOSLYRA) 667 MG/5ML SOLN Take 667 mg by mouth 3 (three) times daily with meals. 1 with snacks    . carbidopa-levodopa (SINEMET IR) 25-100 MG tablet Take 1 tablet by mouth 3 (three) times daily.    . cinacalcet (SENSIPAR) 30 MG tablet Take 30 mg by mouth daily.    . DiphenhydrAMINE HCl (EQ ALLERGY RELIEF PO) Take 1 tablet by mouth every morning.    . donepezil (ARICEPT) 5 MG tablet Take 10 mg by mouth at bedtime.     Marland Kitchen esomeprazole (NEXIUM) 40 MG capsule Take 40 mg by mouth daily at 12 noon.    . gabapentin (NEURONTIN) 100 MG capsule Take 100 mg by mouth 2 (two) times daily.    Marland Kitchen glipiZIDE (GLUCOTROL) 10 MG tablet Take 10 mg by mouth 2 (two) times daily.    . memantine (NAMENDA) 5 MG tablet Take 5 mg by mouth 2 (two) times daily.    Marland Kitchen PARoxetine (PAXIL) 10 MG tablet Take 10 mg by mouth daily.    . pregabalin (LYRICA) 150 MG capsule Take 150 mg by mouth daily.     . sevelamer carbonate (RENVELA) 800 MG tablet Take 800 mg by mouth 3 (three) times daily with meals. 1 with snacks    . traZODone (DESYREL) 50 MG tablet Take 50 mg by mouth at bedtime.    Marland Kitchen acetaminophen (TYLENOL) 500 MG tablet Take 500 mg by mouth every 6 (six) hours as needed.       Discharge Medications: Please see discharge summary for a list of discharge medications.  Relevant Imaging Results:  Relevant Lab Results:   Additional Information SSN [id number]. Please also note: patient goes to dialysis 3x a week-Tuesday, Thursday, Saturday :20 at [facility]   Jonathon Jordan, LCSWA

## 2015-08-10 NOTE — ED Notes (Signed)
Pt awaiting EMS transport back to [facility]

## 2015-08-10 NOTE — Progress Notes (Addendum)
Pt has received bed offers at [facility], [facility], [facility], and [facility]. CSW reviewed bed offers with the family and pt chooses bed at [facility]. Pending PT's evaluation, CSW will accept bed offer and move forward with placement.  Jonathon Jordan, MSW, Theresia Majors [phone]

## 2015-08-10 NOTE — ED Notes (Signed)
Social work called to check status of placement, message left

## 2015-08-10 NOTE — ED Notes (Signed)
PT at bedside.

## 2015-09-30 DIAGNOSIS — F0280 Dementia in other diseases classified elsewhere without behavioral disturbance: Secondary | ICD-10-CM | POA: Diagnosis not present

## 2015-09-30 DIAGNOSIS — R251 Tremor, unspecified: Secondary | ICD-10-CM

## 2015-09-30 DIAGNOSIS — G301 Alzheimer's disease with late onset: Secondary | ICD-10-CM

## 2015-09-30 NOTE — Progress Notes (Signed)
Renee Daugherty was seen today in the movement disorders clinic for neurologic consultation at the request of Jerl Mina, MD.  She is accompanied by her daughter and daughter in law, who supplement the history.   I have reviewed numerous records made available to me, including those of Dr. Malvin Johns and those from the emergency room.  Record review took place on 09/30/15 and took 35 min.  The patient is an 82 year old female who is on hemodialysis and has been for years who first presented to Dr. Malvin Johns on 06/02/2014.  Her complaints at that time were memory change and tremor.  She was already on donepezil at the time and was started on Namenda.  She followed up in August, 2016 and was started on gabapentin 100 twice a day for tremor.  In November, 2016 she was started on clonazepam 0.25 mg twice a day and has worked up to 0.5 mg 3 times per day for tremor.  Daughter states klonopin was d/c.    In July, 2017 a phone call was made to Dr. Daisy Blossom office and she was started on levodopa.  She has worked up 2 po tid.  Daughter thinks that going to 2 tablet three times a day did help.  This was done on 8/28.    Separate from the tremor, she also complains about shaking spells that occur intermittently, both day and night.  Pt states that she has not had one of these in a month or so.  Daughter/daughter in law have never seen these episodes.  Pt has trouble describing the episodes  Tremor: Yes.     How long has it been going on? Several years.  Daughters thinks that it started in one hand;  Has jaw tremor that got better with levodopa.  Having some R foot tremor.  At rest or with activation?  Rest, but daughter states that also shakes with drinking coffee/eating  Affected by caffeine:  No. (1 cup of coffee per day)  Affected by alcohol:  Doesn't drink EtOH  Affected by stress:  Yes.    Affected by fatigue:  No.  Spills soup if on spoon:  Yes.    Spills glass of liquid if full:  Yes.    Affects ADL's  (tying shoes, brushing teeth, etc):  May or may not  Specific Symptoms:  Voice: intermittently she gets very soft per daughter Sleep: trouble staying asleep  Vivid Dreams:  no  Acting out dreams:  No. Wet Pillows: No. Postural symptoms:  Yes.  [facility] been doing some PT and wants pt to use walker but she doesn't.    Falls?  No. Bradykinesia symptoms: trouble getting out of bed so sleeps in recliner; trouble getting in car as has to lift the L leg over the threshold Loss of smell:  No. Loss of taste:  No., but thinks that medication affects taste Urinary Incontinence:  N/a (on HD, makes no urine) Difficulty Swallowing:  No. Handwriting, micrographia: No. Trouble with ADL's:  Yes.   (able to do them but slower than in the past)  Trouble buttoning clothing: No. Depression:  No. Memory changes:  Yes.     The patient also c/o memory issues for about several years.  The patient does not do the finances in the home.   Her daughter does the bills and has for years.  Cannot state why she took over that task.  The patient does not drive and never has.   The  patient does cook.  She mostly cooks on the stove.  The stovetop has not been left on accidentally.  Her daughter prepares her pillbox and has done that for about 6 months.  She has had some trouble with the new levodopa as it is being added at lunch but seems to be doing okay.   There have been some behavioral changes over the years; she is a little more anxious than in the past.  Lives in one story home alone.    Hallucinations:  No.  visual distortions: No. N/V:  No. Lightheaded:  No.  Syncope: No. Diplopia:  No. Dyskinesia:  No.  Neuroimaging has previously been performed.  It is available for my review today.  She had a CT of the brain on 08/09/2015.  I reviewed this.  There was evidence of white matter disease and atrophy.   ALLERGIES:   Allergies  Allergen Reactions  . Metformin     Other reaction(s): Other (See  Comments) GI and renal problems  . Midodrine     Insomnia   . Tape Other (See Comments)    Rash, PT uses paper tape  . Levaquin [Levofloxacin In D5w] Rash    CURRENT MEDICATIONS:  Outpatient Encounter Prescriptions as of 10/03/2015  Medication Sig  . Acetaminophen (TYLENOL 8 HOUR ARTHRITIS PAIN PO) Take by mouth.  Marland Kitchen albuterol (PROVENTIL) (2.5 MG/3ML) 0.083% nebulizer solution Take 2.5 mg by nebulization every 6 (six) hours as needed for wheezing or shortness of breath.  . budesonide-formoterol (SYMBICORT) 160-4.5 MCG/ACT inhaler Inhale 2 puffs into the lungs 2 (two) times daily.  . calcium acetate, Phos Binder, (PHOSLYRA) 667 MG/5ML SOLN Take 667 mg by mouth 3 (three) times daily with meals. 1 with snacks  . carbidopa-levodopa (SINEMET IR) 25-100 MG tablet Take 2 tablets by mouth 3 (three) times daily.   . cinacalcet (SENSIPAR) 30 MG tablet Take 30 mg by mouth daily.  Marland Kitchen donepezil (ARICEPT) 10 MG tablet   . esomeprazole (NEXIUM) 40 MG capsule Take 40 mg by mouth daily at 12 noon.  . gabapentin (NEURONTIN) 100 MG capsule Take 100 mg by mouth daily.   Marland Kitchen glipiZIDE (GLUCOTROL) 10 MG tablet Take 10 mg by mouth 2 (two) times daily.  . memantine (NAMENDA) 5 MG tablet Take 5 mg by mouth 2 (two) times daily.  Marland Kitchen PARoxetine (PAXIL) 10 MG tablet Take 10 mg by mouth daily.  . pioglitazone (ACTOS) 45 MG tablet Take 1 tablet by mouth once daily  . pregabalin (LYRICA) 150 MG capsule Take 150 mg by mouth daily.   . sevelamer carbonate (RENVELA) 800 MG tablet Take 1,600 mg by mouth 3 (three) times daily with meals. 1 with snacks  . traZODone (DESYREL) 50 MG tablet Take 50 mg by mouth at bedtime.  . [DISCONTINUED] acetaminophen (TYLENOL) 500 MG tablet Take 500 mg by mouth every 6 (six) hours as needed.  . [DISCONTINUED] DiphenhydrAMINE HCl (EQ ALLERGY RELIEF PO) Take 1 tablet by mouth every morning.  . [DISCONTINUED] donepezil (ARICEPT) 5 MG tablet Take 10 mg by mouth at bedtime.    No  facility-administered encounter medications on file as of 10/03/2015.     PAST MEDICAL HISTORY:   Past Medical History:  Diagnosis Date  . Chronic kidney disease   . Depression   . Diabetes mellitus without complication (HCC)   . GERD (gastroesophageal reflux disease)   . Lymphedema   . Peripheral vascular disease (HCC)     PAST SURGICAL HISTORY:   Past Surgical History:  Procedure Laterality Date  . CHOLECYSTECTOMY    . PERIPHERAL VASCULAR CATHETERIZATION Left 12/06/2014   Procedure: A/V Shuntogram/Fistulagram;  Surgeon: Annice Needy, MD;  Location: [facility] INVASIVE CV LAB;  Service: Cardiovascular;  Laterality: Left;  . PERIPHERAL VASCULAR CATHETERIZATION N/A 12/06/2014   Procedure: A/V Shunt Intervention;  Surgeon: Annice Needy, MD;  Location: [facility] INVASIVE CV LAB;  Service: Cardiovascular;  Laterality: N/A;    SOCIAL HISTORY:   Social History   Social History  . Marital status: Widowed    Spouse name: N/A  . Number of children: N/A  . Years of education: N/A   Occupational History  . Not on file.   Social History Main Topics  . Smoking status: Never Smoker  . Smokeless tobacco: Not on file  . Alcohol use No  . Drug use: No  . Sexual activity: Not on file   Other Topics Concern  . Not on file   Social History Narrative  . No narrative on file    FAMILY HISTORY:   Family Status  Relation Status  . Mother Deceased  . Father Deceased  . Sister Deceased   x9  . Brother Deceased    ROS:  A complete 10 system review of systems was obtained and was unremarkable apart from what is mentioned above.  PHYSICAL EXAMINATION:    VITALS:   Vitals:   10/03/15 1312  BP: 110/60  Pulse: 72    GEN:  The patient appears stated age and is in NAD. HEENT:  Normocephalic, atraumatic.  The mucous membranes are moist. The superficial temporal arteries are without ropiness or tenderness. CV:  RRR Lungs:  CTAB Neck/HEME:  There are no carotid bruits  bilaterally.  Neurological examination:  Orientation:  Montreal Cognitive Assessment  10/03/2015  Visuospatial/ Executive (0/5) 2  Naming (0/3) 2  Attention: Read list of digits (0/2) 2  Attention: Read list of letters (0/1) 0  Attention: Serial 7 subtraction starting at 100 (0/3) 0  Language: Repeat phrase (0/2) 1  Language : Fluency (0/1) 0  Abstraction (0/2) 0  Delayed Recall (0/5) 0  Orientation (0/6) 4  Total 11  Adjusted Score (based on education) 12   Cranial nerves: There is good facial symmetry. Pupils are equal round and reactive to light bilaterally. Fundoscopic exam is attempted but the disc margins are not well visualized bilaterally.  There is a cataract on the right.   Extraocular muscles are intact. The visual fields are full to confrontational testing. The speech is fluent and clear. Soft palate rises symmetrically and there is no tongue deviation. Hearing is intact to conversational tone. Sensation: Sensation is intact to light and pinprick throughout (facial, trunk, extremities). Vibration is decreased at the bilateral big toe. There is no extinction with double simultaneous stimulation. There is no sensory dermatomal level identified. Motor: Strength is 5/5 in the bilateral upper and lower extremities.   Shoulder shrug is equal and symmetric.  There is no pronator drift. Deep tendon reflexes: Deep tendon reflexes are 2/4 at the bilateral biceps, triceps, brachioradialis, patella and trace at the bilateral achilles. Plantar responses are downgoing bilaterally.  Movement examination: Tone: There is normal tone in the bilateral upper extremities.  The tone in the lower extremities is normal.  Abnormal movements: There is jaw tremor and bilateral UE resting tremor.  No tremor with posture, either proximally or distally.   Coordination:  There is no decremation with RAM's, with any form of RAMS, including alternating supination and pronation of the  forearm, hand opening and  closing, finger taps, heel taps and toe taps. Gait and Station: The patient has difficulty arising out of a deep-seated chair without the use of the hands but has minimal trouble arising with her hands.  Posture is stooped.  Arm swing is decreased.  Gait is narrow based.  Has resting arm tremor with ambulation.  Labs: I reviewed lab work that is available.  Her B12 was 656 in May, 2016.  Her TSH was 2.073.  ASSESSMENT/PLAN:  1.  Tremor  -While she does have a parkinsonian tremor, she does not meet the criteria for idiopathic Parkinson's disease.  This may be, however, because she was seen today on levodopa.  After a long discussion, we decided to skill ahead and schedule her for a levodopa challenge test.  Her family does think that the medication has been helpful once they get up to 2 tablets 3 times per day.  We talked about not taking this with meals, as she is currently eating protein at the same time.  2.  Dementia  -Long discussion with the patient and family today.  The patients constellation of symptoms along with physical examination findings strongly favors a diagnosis of Alzheimers dementia.  We discussed diagnosis, pathophysiology and prognosis.  We discussed community resources for patient and caregiver.  We discussed medications and the fact that they do not prevent memory loss nor do they halt it.  We discussed expectations with memory medications.  She is already on aricept and namenda.  -We discussed the importance of physical and mental exercises and I explained to the patient and family what this means.    -I told the patient and her family that I do not think she should be living alone.  Her family lives right next door, but I still do not think she should be living alone.  We talked about safety within the home.  She is still cooking on the stove top.  I think this is quite unsafe.  She also refuses to use a walker, but is a huge fall risk.  3.  Shaking spells  -I wonder if these  are myoclonic in nature.  Myoclonus is common in dialysis patients and gabapentin/lyrica can exacerbate this.  However, she hasn't had these in a while and really had much trouble describing them to me so I cannot be sure.  In addition, her family has never witnessed these.  There is no change or LOC with them.  4.  Much greater than 50% of this visit was spent in counseling and coordinating care.  Total face to face time:  60 min  This did not include the nonface to face record review.  Record review took place on 09/30/15 and took 35 min.  Cc:  Jerl MinaJames Hedrick, MD

## 2015-10-03 ENCOUNTER — Ambulatory Visit (INDEPENDENT_AMBULATORY_CARE_PROVIDER_SITE_OTHER): Payer: Medicare Other | Admitting: Neurology

## 2015-10-03 ENCOUNTER — Encounter: Payer: Self-pay | Admitting: Neurology

## 2015-10-03 VITALS — BP 110/60 | HR 72

## 2015-10-03 DIAGNOSIS — R251 Tremor, unspecified: Secondary | ICD-10-CM | POA: Diagnosis not present

## 2015-10-03 DIAGNOSIS — F0280 Dementia in other diseases classified elsewhere without behavioral disturbance: Secondary | ICD-10-CM

## 2015-10-03 DIAGNOSIS — G301 Alzheimer's disease with late onset: Secondary | ICD-10-CM

## 2015-11-14 NOTE — Progress Notes (Signed)
Renee Daugherty was seen today in the movement disorders clinic for neurologic consultation at the request of Jerl Mina, MD.  She is accompanied by her daughter and daughter in law, who supplement the history.   I have reviewed numerous records made available to me, including those of Dr. Malvin Johns and those from the emergency room.  Record review took place on 09/30/15 and took 35 min.  The patient is an 82 year old female who is on hemodialysis and has been for years who first presented to Dr. Malvin Johns on 06/02/2014.  Her complaints at that time were memory change and tremor.  She was already on donepezil at the time and was started on Namenda.  She followed up in August, 2016 and was started on gabapentin 100 twice a day for tremor.  In November, 2016 she was started on clonazepam 0.25 mg twice a day and has worked up to 0.5 mg 3 times per day for tremor.  Daughter states klonopin was d/c.    In July, 2017 a phone call was made to Dr. Daisy Blossom office and she was started on levodopa.  She has worked up 2 po tid.  Daughter thinks that going to 2 tablet three times a day did help.  This was done on 8/28.    Separate from the tremor, she also complains about shaking spells that occur intermittently, both day and night.  Pt states that she has not had one of these in a month or so.  Daughter/daughter in law have never seen these episodes.  Pt has trouble describing the episodes  11/15/15 update:  Pt returns today for levodopa challenge test.  She has not taken levodopa for about 36 hours.  This patient is accompanied in the office by her son and daughter who supplement the history.  The patient really notices no difference off of medication.  Neuroimaging has previously been performed.  It is available for my review today.  She had a CT of the brain on 08/09/2015.  I reviewed this.  There was evidence of white matter disease and atrophy.   ALLERGIES:   Allergies  Allergen Reactions  . Metformin    Other reaction(s): Other (See Comments) GI and renal problems  . Midodrine     Insomnia   . Tape Other (See Comments)    Rash, PT uses paper tape  . Levaquin [Levofloxacin In D5w] Rash    CURRENT MEDICATIONS:  Outpatient Encounter Prescriptions as of 11/15/2015  Medication Sig  . Acetaminophen (TYLENOL 8 HOUR ARTHRITIS PAIN PO) Take by mouth.  Marland Kitchen albuterol (PROVENTIL) (2.5 MG/3ML) 0.083% nebulizer solution Take 2.5 mg by nebulization every 6 (six) hours as needed for wheezing or shortness of breath.  . budesonide-formoterol (SYMBICORT) 160-4.5 MCG/ACT inhaler Inhale 2 puffs into the lungs 2 (two) times daily.  . calcium acetate, Phos Binder, (PHOSLYRA) 667 MG/5ML SOLN Take 667 mg by mouth 3 (three) times daily with meals. 1 with snacks  . carbidopa-levodopa (SINEMET IR) 25-100 MG tablet Take 2 tablets by mouth 3 (three) times daily.   . cinacalcet (SENSIPAR) 30 MG tablet Take 30 mg by mouth daily.  Marland Kitchen donepezil (ARICEPT) 10 MG tablet   . esomeprazole (NEXIUM) 40 MG capsule Take 40 mg by mouth daily at 12 noon.  . gabapentin (NEURONTIN) 100 MG capsule Take 100 mg by mouth daily.   Marland Kitchen glipiZIDE (GLUCOTROL) 10 MG tablet Take 10 mg by mouth 2 (two) times daily.  . memantine (NAMENDA) 5 MG tablet Take 5  mg by mouth 2 (two) times daily.  Marland Kitchen. PARoxetine (PAXIL) 10 MG tablet Take 10 mg by mouth daily.  . pioglitazone (ACTOS) 45 MG tablet Take 1 tablet by mouth once daily  . pregabalin (LYRICA) 150 MG capsule Take 150 mg by mouth daily.   . sevelamer carbonate (RENVELA) 800 MG tablet Take 1,600 mg by mouth 3 (three) times daily with meals. 1 with snacks  . traZODone (DESYREL) 50 MG tablet Take 50 mg by mouth at bedtime.  . [EXPIRED] Carbidopa-Levodopa ER (SINEMET CR) 25-100 MG tablet controlled release 3 tablet    No facility-administered encounter medications on file as of 11/15/2015.     PAST MEDICAL HISTORY:   Past Medical History:  Diagnosis Date  . Chronic kidney disease   . Depression     . Diabetes mellitus without complication (HCC)   . GERD (gastroesophageal reflux disease)   . Lymphedema   . Peripheral vascular disease (HCC)     PAST SURGICAL HISTORY:   Past Surgical History:  Procedure Laterality Date  . CHOLECYSTECTOMY    . PERIPHERAL VASCULAR CATHETERIZATION Left 12/06/2014   Procedure: A/V Shuntogram/Fistulagram;  Surgeon: Annice NeedyJason S Dew, MD;  Location: [facility] INVASIVE CV LAB;  Service: Cardiovascular;  Laterality: Left;  . PERIPHERAL VASCULAR CATHETERIZATION N/A 12/06/2014   Procedure: A/V Shunt Intervention;  Surgeon: Annice NeedyJason S Dew, MD;  Location: [facility] INVASIVE CV LAB;  Service: Cardiovascular;  Laterality: N/A;    SOCIAL HISTORY:   Social History   Social History  . Marital status: Widowed    Spouse name: N/A  . Number of children: N/A  . Years of education: N/A   Occupational History  . Not on file.   Social History Main Topics  . Smoking status: Never Smoker  . Smokeless tobacco: Never Used  . Alcohol use No  . Drug use: No  . Sexual activity: Not on file   Other Topics Concern  . Not on file   Social History Narrative  . No narrative on file    FAMILY HISTORY:   Family Status  Relation Status  . Mother Deceased  . Father Deceased  . Sister Deceased   x9  . Brother Deceased    ROS:  A complete 10 system review of systems was obtained and was unremarkable apart from what is mentioned above.  PHYSICAL EXAMINATION:    VITALS:   Vitals:   11/15/15 1333  BP: 100/60  Pulse: 82  SpO2: 91%  Weight: 190 lb 3 oz (86.3 kg)  Height: 5\' 2"  (1.575 m)    GEN:  The patient appears stated age and is in NAD. HEENT:  Normocephalic, atraumatic.  The mucous membranes are moist. The superficial temporal arteries are without ropiness or tenderness. CV:  RRR Lungs:  CTAB Neck/HEME:  There are no carotid bruits bilaterally.  Neurological examination:  Orientation:  Montreal Cognitive Assessment  10/03/2015  Visuospatial/ Executive (0/5) 2   Naming (0/3) 2  Attention: Read list of digits (0/2) 2  Attention: Read list of letters (0/1) 0  Attention: Serial 7 subtraction starting at 100 (0/3) 0  Language: Repeat phrase (0/2) 1  Language : Fluency (0/1) 0  Abstraction (0/2) 0  Delayed Recall (0/5) 0  Orientation (0/6) 4  Total 11  Adjusted Score (based on education) 12   Cranial nerves: There is good facial symmetry.   Extraocular muscles are intact. The visual fields are full to confrontational testing. The speech is fluent and clear. Soft palate rises symmetrically and there  is no tongue deviation. Hearing is intact to conversational tone. Sensation: Sensation is intact to light tpuch throughout. Motor: Strength is 5/5 in the bilateral upper and lower extremities.   Shoulder shrug is equal and symmetric.  There is no pronator drift.   Movement examination: Tone: There is normal tone in the bilateral upper extremities.  The tone in the lower extremities is normal.  Abnormal movements: There is jaw tremor and bilateral UE resting tremor.  No tremor with posture, either proximally or distally.   Coordination:  There is no decremation with RAM's, with any form of RAMS, including alternating supination and pronation of the forearm, hand opening and closing, finger taps, heel taps and toe taps. Gait and Station: The patient is able to arise out of chair without using hands today.  Positive pull test.  Full UPDRS motor done off of carbidopa/levodopa 25/100 was 20.  Pt given 250 mg of levodopa dissolved in ginger ale and waited 30 mins and repeated UPDRS motor score and it was 16.  ASSESSMENT/PLAN:  1.  Tremor  -While she does have a parkinsonian tremor, she does not meet the criteria for idiopathic Parkinson's disease.  Levodopa challenge test done 11/15/15 showed no significant benefit to levodopa and I would recommend that the medication be d/c.  -We talked about other treatments for tremor, but ultimately I told her I would  probably not recommend any other treatments, given that most of them that our left have significant potential cognitive side effects (amantadine, Artane, Cogentin, topiramate, etc.).  2.  Dementia  -Long discussion with the patient and family today.  Her son was not here last visit, but was here today and we discussed again her diagnosis.  The patients constellation of symptoms along with physical examination findings strongly favors a diagnosis of Alzheimers dementia.  We discussed diagnosis, pathophysiology and prognosis.  We discussed community resources for patient and caregiver.  We discussed medications and the fact that they do not prevent memory loss nor do they halt it.  We discussed expectations with memory medications.  She is already on aricept and namenda.  -We discussed the importance of physical and mental exercises and I explained to the patient and family what this means.    -I told the patient and her family that I do not think she should be living alone.  I stressed this with both the patient and her daughter today.  Her family lives right next door, but I still do not think she should be living alone.  We talked about safety within the home.  She is still cooking on the stove top.  I think this is quite unsafe.  She also refuses to use a walker, but is a huge fall risk.  3.  Shaking spells  -I wonder if these are myoclonic in nature.  Myoclonus is common in dialysis patients and gabapentin/lyrica can exacerbate this.  However, she hasn't had these in a while and really had much trouble describing them to me so I cannot be sure.  In addition, her family has never witnessed these.  There is no change or LOC with them.  4.  Much greater than 50% of this visit was spent in counseling and coordinating care.  Total face to face time:  60 min  This did not include the time waiting for her levodopa to kick in.  (30 minutes).  She is going to be following up with Dr. Malvin Johns and already has an  appointment late next month.  She does not need to follow here, unless something new arises.  Cc:  Jerl MinaJames Hedrick, MD

## 2015-11-15 ENCOUNTER — Ambulatory Visit: Payer: Medicare Other | Admitting: Neurology

## 2015-11-15 ENCOUNTER — Ambulatory Visit (INDEPENDENT_AMBULATORY_CARE_PROVIDER_SITE_OTHER): Payer: Medicare Other | Admitting: Neurology

## 2015-11-15 ENCOUNTER — Encounter: Payer: Self-pay | Admitting: Neurology

## 2015-11-15 VITALS — BP 100/60 | HR 82 | Ht 62.0 in | Wt 190.2 lb

## 2015-11-15 DIAGNOSIS — F0280 Dementia in other diseases classified elsewhere without behavioral disturbance: Secondary | ICD-10-CM | POA: Diagnosis not present

## 2015-11-15 DIAGNOSIS — G301 Alzheimer's disease with late onset: Secondary | ICD-10-CM | POA: Diagnosis not present

## 2015-11-15 DIAGNOSIS — R251 Tremor, unspecified: Secondary | ICD-10-CM | POA: Diagnosis not present

## 2015-11-15 MED ORDER — CARBIDOPA-LEVODOPA ER 25-100 MG PO TBCR
3.0000 | EXTENDED_RELEASE_TABLET | Freq: Once | ORAL | Status: AC
  Administered 2015-11-15: 3 via ORAL

## 2015-11-16 MED ORDER — CARBIDOPA-LEVODOPA 25-100 MG PO TABS
2.5000 | ORAL_TABLET | Freq: Once | ORAL | Status: AC
  Administered 2015-11-16: 2.5 via ORAL

## 2015-11-16 NOTE — Addendum Note (Signed)
Addended bySilvio Pate: Aloys Hupfer L on: 11/16/2015 09:12 AM   Modules accepted: Orders

## 2015-11-18 ENCOUNTER — Other Ambulatory Visit (INDEPENDENT_AMBULATORY_CARE_PROVIDER_SITE_OTHER): Payer: Self-pay | Admitting: Vascular Surgery

## 2015-11-18 DIAGNOSIS — N186 End stage renal disease: Secondary | ICD-10-CM

## 2015-11-18 DIAGNOSIS — T829XXS Unspecified complication of cardiac and vascular prosthetic device, implant and graft, sequela: Secondary | ICD-10-CM

## 2015-11-21 ENCOUNTER — Encounter (INDEPENDENT_AMBULATORY_CARE_PROVIDER_SITE_OTHER): Payer: Self-pay | Admitting: Vascular Surgery

## 2015-11-21 ENCOUNTER — Ambulatory Visit (INDEPENDENT_AMBULATORY_CARE_PROVIDER_SITE_OTHER): Payer: Medicare Other | Admitting: Vascular Surgery

## 2015-11-21 ENCOUNTER — Encounter (INDEPENDENT_AMBULATORY_CARE_PROVIDER_SITE_OTHER): Payer: Self-pay

## 2015-11-21 ENCOUNTER — Ambulatory Visit (INDEPENDENT_AMBULATORY_CARE_PROVIDER_SITE_OTHER): Payer: Medicare Other

## 2015-11-21 ENCOUNTER — Other Ambulatory Visit (INDEPENDENT_AMBULATORY_CARE_PROVIDER_SITE_OTHER): Payer: Self-pay | Admitting: Vascular Surgery

## 2015-11-21 DIAGNOSIS — M79602 Pain in left arm: Secondary | ICD-10-CM

## 2015-11-21 DIAGNOSIS — T829XXS Unspecified complication of cardiac and vascular prosthetic device, implant and graft, sequela: Secondary | ICD-10-CM

## 2015-11-21 DIAGNOSIS — N186 End stage renal disease: Secondary | ICD-10-CM | POA: Diagnosis not present

## 2015-11-21 DIAGNOSIS — I10 Essential (primary) hypertension: Secondary | ICD-10-CM | POA: Diagnosis not present

## 2015-11-21 DIAGNOSIS — M79609 Pain in unspecified limb: Secondary | ICD-10-CM | POA: Insufficient documentation

## 2015-11-21 DIAGNOSIS — T829XXA Unspecified complication of cardiac and vascular prosthetic device, implant and graft, initial encounter: Secondary | ICD-10-CM | POA: Insufficient documentation

## 2015-11-21 NOTE — Progress Notes (Signed)
[facility] Admission History & Physical  MRN : [id number]  Renee Daugherty is a 82 y.o. ([birth year]) female who presents with chief complaint of  Chief Complaint  Patient presents with  . Re-evaluation    4 month follow up ultrasound  .  History of Present Illness: The patient returns to the office for follow up regarding problem with the dialysis access. Currently the patient is maintained via a hybrid left brachial cephalic fistula.    The patient notes a significant increase in bleeding time after decannulation and sustained a moderate hematoma last week after decannnulation.  The patient has also been informed that there is increased recirculation.    The patient denies hand pain or other symptoms consistent with steal phenomena.  No significant arm swelling.  The patient denies redness or swelling at the access site. The patient denies fever or chills at home or while on dialysis.  The patient denies amaurosis fugax or recent TIA symptoms. There are no recent neurological changes noted. The patient denies claudication symptoms or rest pain symptoms. The patient denies history of DVT, PE or superficial thrombophlebitis. The patient denies recent episodes of angina or shortness of breath.      Current Meds  Medication Sig  . Acetaminophen (TYLENOL 8 HOUR ARTHRITIS PAIN PO) Take by mouth.  Marland Kitchen. albuterol (PROVENTIL) (2.5 MG/3ML) 0.083% nebulizer solution Take 2.5 mg by nebulization every 6 (six) hours as needed for wheezing or shortness of breath.  . budesonide-formoterol (SYMBICORT) 160-4.5 MCG/ACT inhaler Inhale 2 puffs into the lungs 2 (two) times daily.  . calcium acetate, Phos Binder, (PHOSLYRA) 667 MG/5ML SOLN Take 667 mg by mouth 3 (three) times daily with meals. 1 with snacks  . cinacalcet (SENSIPAR) 30 MG tablet Take 30 mg by mouth daily.  Marland Kitchen. donepezil (ARICEPT) 10 MG tablet   . esomeprazole (NEXIUM) 40 MG capsule Take 40 mg by mouth daily at 12 noon.   . gabapentin (NEURONTIN) 100 MG capsule Take 100 mg by mouth daily.   Marland Kitchen. glipiZIDE (GLUCOTROL) 10 MG tablet Take 10 mg by mouth 2 (two) times daily.  . memantine (NAMENDA) 5 MG tablet Take 5 mg by mouth 2 (two) times daily.  Marland Kitchen. PARoxetine (PAXIL) 10 MG tablet Take 10 mg by mouth daily.  . pioglitazone (ACTOS) 45 MG tablet Take 1 tablet by mouth once daily  . pregabalin (LYRICA) 150 MG capsule Take 150 mg by mouth daily.   . traZODone (DESYREL) 50 MG tablet Take 50 mg by mouth at bedtime.    Past Medical History:  Diagnosis Date  . Chronic kidney disease   . Depression   . Diabetes mellitus without complication (HCC)   . GERD (gastroesophageal reflux disease)   . Lymphedema   . Peripheral vascular disease [facility](HCC)     Past Surgical History:  Procedure Laterality Date  . CHOLECYSTECTOMY    . PERIPHERAL VASCULAR CATHETERIZATION Left 12/06/2014   Procedure: A/V Shuntogram/Fistulagram;  Surgeon: Annice NeedyJason S Dew, MD;  Location: [facility] INVASIVE CV LAB;  Service: Cardiovascular;  Laterality: Left;  . PERIPHERAL VASCULAR CATHETERIZATION N/A 12/06/2014   Procedure: A/V Shunt Intervention;  Surgeon: Annice NeedyJason S Dew, MD;  Location: [facility] INVASIVE CV LAB;  Service: Cardiovascular;  Laterality: N/A;    Social History Social History  Substance Use Topics  . Smoking status: Never Smoker  . Smokeless tobacco: Never Used  . Alcohol use No    Family History Family History  Problem Relation Age of Onset  . Diabetes Father   .  Cancer Sister   . Heart failure Sister   . Brain cancer Brother      No family history of bleeding/clotting disorders, porphyria or autoimmune disease   Allergies  Allergen Reactions  . Metformin     Other reaction(s): Other (See Comments) GI and renal problems  . Midodrine     Insomnia   . Tape Other (See Comments)    Rash, PT uses paper tape  . Levaquin [Levofloxacin In D5w] Rash     REVIEW OF SYSTEMS (Negative unless checked)  Constitutional: [] Weight loss   [] Fever  [] Chills Cardiac: [] Chest pain   [] Chest pressure   [] Palpitations   [] Shortness of breath when laying flat   [] Shortness of breath with exertion. Vascular:  [] Pain in legs with walking   [] Pain in legs at rest  [] History of DVT   [] Phlebitis   [x] Swelling in legs   [] Varicose veins   [] Non-healing ulcers Pulmonary:   [] Uses home oxygen   [] Productive cough   [] Hemoptysis   [] Wheeze  [] COPD   [] Asthma Neurologic:  [] Dizziness   [] Seizures   [] History of stroke   [] History of TIA  [] Aphasia   [] Vissual changes   [] Weakness or numbness in arm   [] Weakness or numbness in leg Musculoskeletal:   [] Joint swelling   [x] Joint pain   [] Low back pain Hematologic:  [] Easy bruising  [] Easy bleeding   [] Hypercoagulable state   [] Anemic Gastrointestinal:  [] Diarrhea   [] Vomiting  [] Gastroesophageal reflux/heartburn   [] Difficulty swallowing. Genitourinary:  [x] Chronic kidney disease   [] Difficult urination  [] Frequent urination   [] Blood in urine Skin:  [] Rashes   [] Ulcers  Psychological:  [] History of anxiety   []  History of major depression.  Physical Examination  Vitals:   11/21/15 1346  BP: (!) 143/66  Pulse: 72  Resp: 17  Weight: 188 lb (85.3 kg)  Height: 5\' 5"  (1.651 m)   Body mass index is 31.28 kg/m. Gen: WD/WN, NAD Head: /AT, No temporalis wasting.  Ear/Nose/Throat: Hearing grossly intact, nares w/o erythema or drainage, poor dentition Eyes: PER, EOMI, sclera nonicteric.  Neck: Supple, no masses.  No bruit or JVD.  Pulmonary:  Good air movement, clear to auscultation bilaterally, no use of accessory muscles.  Cardiac: RRR, normal S1, S2, no Murmurs. Vascular: left arm fistula pulsatile with stacato bruit and weak thrill Vessel Right Left  Radial Palpable Palpable  Ulnar Palpable Palpable  Brachial Palpable Palpable  Carotid Palpable Palpable  Femoral Palpable Palpable  Popliteal Palpable Palpable  PT Palpable Palpable  DP Palpable Palpable   Gastrointestinal: soft,  non-distended. No guarding/no peritoneal signs.  Musculoskeletal: M/S 5/5 throughout.  No deformity or atrophy.  Neurologic: CN 2-12 intact. Pain and light touch intact in extremities.  Symmetrical.  Speech is fluent. Motor exam as listed above. Psychiatric: Judgment intact, Mood & affect appropriate for pt's clinical situation. Dermatologic: No rashes or ulcers noted.  No changes consistent with cellulitis. Lymph : No Cervical lymphadenopathy, no lichenification or skin changes of chronic lymphedema.  CBC Lab Results  Component Value Date   WBC 7.5 08/09/2015   HGB 13.4 08/09/2015   HCT 40.7 08/09/2015   MCV 98.4 08/09/2015   PLT 217 08/09/2015    BMET    Component Value Date/Time   NA 135 08/09/2015 2111   NA 137 04/07/2014 1451   K 4.4 08/09/2015 2111   K 4.3 04/07/2014 1451   CL 95 (L) 08/09/2015 2111   CL 94 (L) 04/07/2014 1451   CO2 29  08/09/2015 2111   CO2 31 04/07/2014 1451   GLUCOSE 200 (H) 08/09/2015 2111   GLUCOSE 165 (H) 04/07/2014 1451   BUN 26 (H) 08/09/2015 2111   BUN 25 (H) 04/07/2014 1451   CREATININE 5.28 (H) 08/09/2015 2111   CREATININE 6.02 (H) 04/07/2014 1451   CALCIUM 8.7 (L) 08/09/2015 2111   CALCIUM 9.3 04/07/2014 1451   GFRNONAA 7 (L) 08/09/2015 2111   GFRNONAA 6 (L) 04/07/2014 1451   GFRAA 8 (L) 08/09/2015 2111   GFRAA 7 (L) 04/07/2014 1451   CrCl cannot be calculated (Patient's most recent lab result is older than the maximum 21 days allowed.).  COAG Lab Results  Component Value Date   INR 1.0 04/07/2014    Radiology No results found.   Assessment/Plan 1. End stage renal disease (HCC) Recommend:  The patient is experiencing increasing problems with their dialysis access.  Patient should have a fistulagram with the intention for intervention.  The intention for intervention is to restore appropriate flow and prevent thrombosis and possible loss of the access.  As well as improve the quality of dialysis therapy.  The risks,  benefits and alternative therapies were reviewed in detail with the patient.  All questions were answered.  The patient agrees to proceed with angio/intervention.      2. Complication of vascular access for dialysis, sequela See plan #1  3. Pain of left upper extremity Conservative measures hematoma is resolving.  Compresses and tylenol as needed  4. Essential hypertension Continue antihypertensive medications as already ordered an reviewed, no changes at this time.     Levora DredgeGregory Schnier, MD  11/21/2015 2:31 PM

## 2015-11-23 ENCOUNTER — Encounter: Payer: Self-pay | Admitting: *Deleted

## 2015-11-23 ENCOUNTER — Encounter
Admission: RE | Disposition: A | Discharge status: Home / Self Care | Payer: Self-pay | Source: Ambulatory Visit | Attending: Vascular Surgery

## 2015-11-23 ENCOUNTER — Ambulatory Visit
Admission: RE | Admit: 2015-11-23 | Discharge: 2015-11-23 | Disposition: A | Discharge status: Home / Self Care | Payer: Medicare Other | Source: Ambulatory Visit | Attending: Vascular Surgery | Admitting: Vascular Surgery

## 2015-11-23 DIAGNOSIS — T82858A Stenosis of vascular prosthetic devices, implants and grafts, initial encounter: Secondary | ICD-10-CM | POA: Diagnosis present

## 2015-11-23 DIAGNOSIS — Z9049 Acquired absence of other specified parts of digestive tract: Secondary | ICD-10-CM | POA: Diagnosis not present

## 2015-11-23 DIAGNOSIS — Z881 Allergy status to other antibiotic agents status: Secondary | ICD-10-CM | POA: Diagnosis not present

## 2015-11-23 DIAGNOSIS — Z888 Allergy status to other drugs, medicaments and biological substances status: Secondary | ICD-10-CM | POA: Diagnosis not present

## 2015-11-23 DIAGNOSIS — Z833 Family history of diabetes mellitus: Secondary | ICD-10-CM | POA: Insufficient documentation

## 2015-11-23 DIAGNOSIS — Z8249 Family history of ischemic heart disease and other diseases of the circulatory system: Secondary | ICD-10-CM | POA: Diagnosis not present

## 2015-11-23 DIAGNOSIS — Z9109 Other allergy status, other than to drugs and biological substances: Secondary | ICD-10-CM | POA: Insufficient documentation

## 2015-11-23 DIAGNOSIS — K219 Gastro-esophageal reflux disease without esophagitis: Secondary | ICD-10-CM | POA: Insufficient documentation

## 2015-11-23 DIAGNOSIS — E1122 Type 2 diabetes mellitus with diabetic chronic kidney disease: Secondary | ICD-10-CM | POA: Insufficient documentation

## 2015-11-23 DIAGNOSIS — I889 Nonspecific lymphadenitis, unspecified: Secondary | ICD-10-CM | POA: Insufficient documentation

## 2015-11-23 DIAGNOSIS — I120 Hypertensive chronic kidney disease with stage 5 chronic kidney disease or end stage renal disease: Secondary | ICD-10-CM | POA: Diagnosis not present

## 2015-11-23 DIAGNOSIS — Z992 Dependence on renal dialysis: Secondary | ICD-10-CM | POA: Diagnosis not present

## 2015-11-23 DIAGNOSIS — N186 End stage renal disease: Secondary | ICD-10-CM | POA: Insufficient documentation

## 2015-11-23 DIAGNOSIS — Z809 Family history of malignant neoplasm, unspecified: Secondary | ICD-10-CM | POA: Insufficient documentation

## 2015-11-23 DIAGNOSIS — Y838 Other surgical procedures as the cause of abnormal reaction of the patient, or of later complication, without mention of misadventure at the time of the procedure: Secondary | ICD-10-CM | POA: Diagnosis not present

## 2015-11-23 DIAGNOSIS — Z808 Family history of malignant neoplasm of other organs or systems: Secondary | ICD-10-CM | POA: Diagnosis not present

## 2015-11-23 DIAGNOSIS — T82868A Thrombosis of vascular prosthetic devices, implants and grafts, initial encounter: Secondary | ICD-10-CM | POA: Diagnosis not present

## 2015-11-23 HISTORY — PX: PERIPHERAL VASCULAR CATHETERIZATION: SHX172C

## 2015-11-23 HISTORY — DX: Parkinson's disease: G20

## 2015-11-23 HISTORY — DX: Unspecified osteoarthritis, unspecified site: M19.90

## 2015-11-23 HISTORY — DX: Parkinson's disease without dyskinesia, without mention of fluctuations: G20.A1

## 2015-11-23 LAB — POTASSIUM (ARMC VASCULAR LAB ONLY): Potassium (ARMC vascular lab): 4.2 (ref 3.5–5.1)

## 2015-11-23 SURGERY — A/V SHUNTOGRAM/FISTULAGRAM
Anesthesia: Moderate Sedation | Laterality: Left

## 2015-11-23 MED ORDER — HEPARIN SODIUM (PORCINE) 1000 UNIT/ML IJ SOLN
INTRAMUSCULAR | Status: DC | PRN
  Administered 2015-11-23: 3000 [IU] via INTRAVENOUS

## 2015-11-23 MED ORDER — MIDAZOLAM HCL 5 MG/5ML IJ SOLN
INTRAMUSCULAR | Status: AC
  Filled 2015-11-23: qty 5

## 2015-11-23 MED ORDER — SODIUM CHLORIDE 0.9 % IV SOLN
INTRAVENOUS | Status: DC
  Administered 2015-11-23: 1000 mL via INTRAVENOUS

## 2015-11-23 MED ORDER — FENTANYL CITRATE (PF) 100 MCG/2ML IJ SOLN
INTRAMUSCULAR | Status: AC
  Filled 2015-11-23: qty 2

## 2015-11-23 MED ORDER — LIDOCAINE-EPINEPHRINE (PF) 1 %-1:200000 IJ SOLN
INTRAMUSCULAR | Status: AC
  Filled 2015-11-23: qty 30

## 2015-11-23 MED ORDER — FENTANYL CITRATE (PF) 100 MCG/2ML IJ SOLN
INTRAMUSCULAR | Status: DC | PRN
  Administered 2015-11-23: 25 ug via INTRAVENOUS
  Administered 2015-11-23: 50 ug via INTRAVENOUS

## 2015-11-23 MED ORDER — METHYLPREDNISOLONE SODIUM SUCC 125 MG IJ SOLR: 125.0000 mg | INTRAMUSCULAR | Status: DC | PRN

## 2015-11-23 MED ORDER — CEFUROXIME SODIUM 1.5 G IJ SOLR: 1.5000 g | INTRAMUSCULAR | Status: DC

## 2015-11-23 MED ORDER — HYDROMORPHONE HCL 1 MG/ML IJ SOLN: 1.0000 mg | Freq: Once | INTRAMUSCULAR | Status: DC

## 2015-11-23 MED ORDER — HEPARIN SODIUM (PORCINE) 1000 UNIT/ML IJ SOLN
INTRAMUSCULAR | Status: AC
Start: 2015-11-23 — End: 2015-11-23
  Filled 2015-11-23: qty 1

## 2015-11-23 MED ORDER — HEPARIN (PORCINE) IN NACL 2-0.9 UNIT/ML-% IJ SOLN
INTRAMUSCULAR | Status: AC
  Filled 2015-11-23: qty 1000

## 2015-11-23 MED ORDER — MIDAZOLAM HCL 2 MG/2ML IJ SOLN
INTRAMUSCULAR | Status: DC | PRN
  Administered 2015-11-23: 0.5 mg via INTRAVENOUS
  Administered 2015-11-23: 1 mg via INTRAVENOUS

## 2015-11-23 MED ORDER — FAMOTIDINE 20 MG PO TABS: 40.0000 mg | ORAL_TABLET | ORAL | Status: DC | PRN

## 2015-11-23 MED ORDER — ONDANSETRON HCL 4 MG/2ML IJ SOLN: 4.0000 mg | Freq: Four times a day (QID) | INTRAMUSCULAR | Status: DC | PRN

## 2015-11-23 SURGICAL SUPPLY — 15 items
BALLN LUTONIX AV 12X40X75 (BALLOONS) ×3
BALLN LUTONIX AV 8X60X75 (BALLOONS) ×6
BALLOON LUTONIX AV 12X40X75 (BALLOONS) ×1 IMPLANT
BALLOON LUTONIX AV 8X60X75 (BALLOONS) ×2 IMPLANT
CATH BEACON 5.038 65CM KMP-01 (CATHETERS) ×3 IMPLANT
DEVICE PRESTO INFLATION (MISCELLANEOUS) ×3 IMPLANT
DEVICE TORQUE (MISCELLANEOUS) ×3 IMPLANT
DRAPE BRACHIAL (DRAPES) ×3 IMPLANT
GLIDEWIRE STIFF .35X180X3 HYDR (WIRE) ×3 IMPLANT
PACK ANGIOGRAPHY (CUSTOM PROCEDURE TRAY) ×3 IMPLANT
SET INTRO CAPELLA COAXIAL (SET/KITS/TRAYS/PACK) ×3 IMPLANT
SHEATH BRITE TIP 6FRX5.5 (SHEATH) ×3 IMPLANT
SHEATH BRITE TIP 7FRX5.5 (SHEATH) ×3 IMPLANT
TOWEL OR 17X26 4PK STRL BLUE (TOWEL DISPOSABLE) ×3 IMPLANT
WIRE MAGIC TORQUE 260C (WIRE) ×3 IMPLANT

## 2015-11-23 NOTE — Discharge Instructions (Signed)
Fistulogram, Care After °Refer to this sheet in the next few weeks. These instructions provide you with information on caring for yourself after your procedure. Your health care provider may also give you more specific instructions. Your treatment has been planned according to current medical practices, but problems sometimes occur. Call your health care provider if you have any problems or questions after your procedure. °WHAT TO EXPECT AFTER THE PROCEDURE °After your procedure, it is typical to have the following: °· A small amount of discomfort in the area where the catheters were placed. °· A small amount of bruising around the fistula. °· Sleepiness and fatigue. °HOME CARE INSTRUCTIONS °· Rest at home for the day following your procedure. °· Do not drive or operate heavy machinery while taking pain medicine. °· Take medicines only as directed by your health care provider. °· Do not take baths, swim, or use a hot tub until your health care provider approves. You may shower 24 hours after the procedure or as directed by your health care provider. °· There are many different ways to close and cover an incision, including stitches, skin glue, and adhesive strips. Follow your health care provider's instructions on: °¨ Incision care. °¨ Bandage (dressing) changes and removal. °¨ Incision closure removal. °· Monitor your dialysis fistula carefully. °SEEK MEDICAL CARE IF: °· You have drainage, redness, swelling, or pain at your catheter site. °· You have a fever. °· You have chills. °SEEK IMMEDIATE MEDICAL CARE IF: °· You feel weak. °· You have trouble balancing. °· You have trouble moving your arms or legs. °· You have problems with your speech or vision. °· You can no longer feel a vibration or buzz when you put your fingers over your dialysis fistula. °· The limb that was used for the procedure: °¨ Swells. °¨ Is painful. °¨ Is cold. °¨ Is discolored, such as blue or pale white. °  °This information is not intended  to replace advice given to you by your health care provider. Make sure you discuss any questions you have with your health care provider. °  °Document Released: 05/18/2013 Document Reviewed: 05/18/2013 °Elsevier Interactive Patient Education ©2016 [employer] ° °

## 2015-11-23 NOTE — H&P (Signed)
[facility] History & Physical Update  The patient was interviewed and re-examined.  The patient's previous History and Physical has been reviewed and is unchanged.  There is no change in the plan of care. We plan to proceed with the scheduled procedure.  Levora DredgeGregory Maxamus Colao, MD  11/23/2015, 3:03 PM

## 2015-11-23 NOTE — Op Note (Signed)
OPERATIVE NOTE   PROCEDURE: 1. Contrast injection left brachiocephalic  AV access 2. Percutaneous transluminal angioplasty peripheral segment to 8 mm with a Lutonix balloon 3. Percutaneous transluminal angioplasty central venous segment to 12 mm with a Lutonix balloon  PRE-OPERATIVE DIAGNOSIS: Complication of dialysis access                                                       End Stage Renal Disease  POST-OPERATIVE DIAGNOSIS: same as above   SURGEON: Katha Cabal, M.D.  ANESTHESIA: Conscious sedation was administered under my direct supervision. IV Versed plus fentanyl were utilized. Continuous ECG, pulse oximetry and blood pressure was monitored throughout the entire procedure.  Conscious sedation was for a total of 45.  ESTIMATED BLOOD LOSS: minimal  FINDING(S): Stricture of the AV graft within the peripheral segment as well as a stricture within the central venous portion  SPECIMEN(S):  None  CONTRAST: 30 cc  FLUOROSCOPY TIME: 6.5 minutes  INDICATIONS: Renee Daugherty is a 82 y.o. female who  presents with malfunctioning left arm AV access.  The patient is scheduled for angiography with possible intervention of the AV access.  The patient is aware the risks include but are not limited to: bleeding, infection, thrombosis of the cannulated access, and possible anaphylactic reaction to the contrast.  The patient acknowledges if the access can not be salvaged a tunneled catheter will be needed and will be placed during this procedure.  The patient is aware of the risks of the procedure and elects to proceed with the angiogram and intervention.  DESCRIPTION: After full informed written consent was obtained, the patient was brought back to the Special Procedure suite and placed supine position.  Appropriate cardiopulmonary monitors were placed.  The left arm was prepped and draped in the standard fashion.  Appropriate timeout is called. The left AV access  was cannulated with a  micropuncture needle.  Cannulation was performed with ultrasound guidance. Ultrasound was placed in a sterile sleeve, the AV access was interrogated and noted to be echolucent and compressible indicating patency. Image was recorded for the permanent record. The puncture is performed under continuous ultrasound visualization.   The microwire was advanced and the needle was exchanged for  a microsheath.  The J-wire was then advanced and a 6 Fr sheath inserted.  Hand injections were completed to image the access from the arterial anastomosis through the entire access.  The central venous structures were also imaged by hand injections.  Based on the images,  3000 units of heparin was given and a wire was negotiated through the strictures within the venous portion of the graft as well as the central stenosis. The sheath was then upsized to a 7 [place] sheath.  An 8 x 6 Lutonix balloon was used.  Inflation was to 14 atm for 2 minutes. Follow-up imaging demonstrated moderate residual stenosis and therefore a 12 x 4 Lutonix balloon was positioned across the central lesion and inflated to 14 atm for 2 minutes. Follow-up imaging demonstrated a good result with a significant reduction in the stenosis.  The detector was then repositioned over the peripheral portion of the AV access and 8 x 6 Lutonix balloon was used to treat the stricture within the AV access. Inflation was to 14 atm for 2 minutes.  Follow-up imaging demonstrates significant  improvement with a marked reduction of the strictures.  There is now rapid flow of contrast through the graft and the central veins.   A 4-0 Monocryl purse-string suture was sewn around the sheath.  The sheath was removed and light pressure was applied.  A sterile bandage was applied to the puncture site.    COMPLICATIONS: None  CONDITION: Renee Daugherty, M.D [facility] Office: [phone]  11/23/2015 4:02 PM

## 2015-11-24 ENCOUNTER — Encounter: Payer: Self-pay | Admitting: Vascular Surgery

## 2015-12-02 ENCOUNTER — Ambulatory Visit: Payer: Medicare Other | Admitting: Neurology

## 2016-01-11 ENCOUNTER — Emergency Department
Admission: EM | Admit: 2016-01-11 | Discharge: 2016-01-11 | Disposition: A | Discharge status: Home / Self Care | Payer: Medicare Other | Attending: Emergency Medicine | Admitting: Emergency Medicine

## 2016-01-11 ENCOUNTER — Emergency Department: Payer: Medicare Other

## 2016-01-11 ENCOUNTER — Encounter: Payer: Self-pay | Admitting: Emergency Medicine

## 2016-01-11 DIAGNOSIS — N186 End stage renal disease: Secondary | ICD-10-CM | POA: Insufficient documentation

## 2016-01-11 DIAGNOSIS — Z7984 Long term (current) use of oral hypoglycemic drugs: Secondary | ICD-10-CM | POA: Insufficient documentation

## 2016-01-11 DIAGNOSIS — E1122 Type 2 diabetes mellitus with diabetic chronic kidney disease: Secondary | ICD-10-CM | POA: Insufficient documentation

## 2016-01-11 DIAGNOSIS — J069 Acute upper respiratory infection, unspecified: Secondary | ICD-10-CM | POA: Insufficient documentation

## 2016-01-11 DIAGNOSIS — M25511 Pain in right shoulder: Secondary | ICD-10-CM | POA: Diagnosis not present

## 2016-01-11 DIAGNOSIS — G20 Parkinson's disease: Secondary | ICD-10-CM | POA: Diagnosis not present

## 2016-01-11 DIAGNOSIS — I120 Hypertensive chronic kidney disease with stage 5 chronic kidney disease or end stage renal disease: Secondary | ICD-10-CM | POA: Diagnosis not present

## 2016-01-11 DIAGNOSIS — Z992 Dependence on renal dialysis: Secondary | ICD-10-CM | POA: Insufficient documentation

## 2016-01-11 DIAGNOSIS — R05 Cough: Secondary | ICD-10-CM | POA: Diagnosis present

## 2016-01-11 DIAGNOSIS — Z79899 Other long term (current) drug therapy: Secondary | ICD-10-CM | POA: Insufficient documentation

## 2016-01-11 LAB — CBC WITH DIFFERENTIAL/PLATELET
BASOS PCT: 1 %
Basophils Absolute: 0.1 10*3/uL (ref 0–0.1)
Eosinophils Absolute: 0 10*3/uL (ref 0–0.7)
Eosinophils Relative: 0 %
HEMATOCRIT: 44.2 % (ref 35.0–47.0)
HEMOGLOBIN: 14.6 g/dL (ref 12.0–16.0)
LYMPHS ABS: 0.6 10*3/uL — AB (ref 1.0–3.6)
LYMPHS PCT: 5 %
MCH: 31.9 pg (ref 26.0–34.0)
MCHC: 33 g/dL (ref 32.0–36.0)
MCV: 96.6 fL (ref 80.0–100.0)
MONO ABS: 0.9 10*3/uL (ref 0.2–0.9)
MONOS PCT: 7 %
NEUTROS ABS: 10.8 10*3/uL — AB (ref 1.4–6.5)
NEUTROS PCT: 87 %
Platelets: 248 10*3/uL (ref 150–440)
RBC: 4.57 MIL/uL (ref 3.80–5.20)
RDW: 16.8 % — ABNORMAL HIGH (ref 11.5–14.5)
WBC: 12.4 10*3/uL — ABNORMAL HIGH (ref 3.6–11.0)

## 2016-01-11 LAB — BASIC METABOLIC PANEL
Anion gap: 16 — ABNORMAL HIGH (ref 5–15)
BUN: 39 mg/dL — ABNORMAL HIGH (ref 6–20)
CHLORIDE: 94 mmol/L — AB (ref 101–111)
CO2: 25 mmol/L (ref 22–32)
CREATININE: 6.61 mg/dL — AB (ref 0.44–1.00)
Calcium: 8.9 mg/dL (ref 8.9–10.3)
GFR calc non Af Amer: 5 mL/min — ABNORMAL LOW (ref >60)
GFR, EST AFRICAN AMERICAN: 6 mL/min — AB (ref >60)
GLUCOSE: 337 mg/dL — AB (ref 65–99)
Potassium: 5.3 mmol/L — ABNORMAL HIGH (ref 3.5–5.1)
Sodium: 135 mmol/L (ref 135–145)

## 2016-01-11 MED ORDER — HYDROCODONE-ACETAMINOPHEN 5-325 MG PO TABS: 1.0000 | ORAL_TABLET | ORAL | 0 refills | Status: DC | PRN

## 2016-01-11 NOTE — ED Provider Notes (Signed)
[facility] Emergency Department Provider Note  Time seen: 12:48 PM  I have reviewed the triage vital signs and the nursing notes.   HISTORY  Chief Complaint Shoulder Pain    HPI Renee Daugherty is a 82 y.o. female with a past medical history of end-stage renal disease on hemodialysis, diabetes, gastric reflux, Parkinson's, dementia, presents to the emergency department for right shoulder pain. According to the patient for the past 1 week she's been having pain in the right shoulder worse with any type of movement of the right arm. Patient relates this to having her blood pressure checked 1 week ago. Denies any falls or trauma. Patient also states for the past 2 weeks she has had a mild cough. Denies any chest pain. Patient is on dialysis with a left upper extremity fistula. Upon arrival the patient is febrile to 100.3. States she called her doctor several days ago who called her in an amoxicillin prescription for her cough and congestion along with Ultram for her right shoulder pain. States no relief of her right shoulder pain with Ultram. [facility] and [facility] referred her to the emergency department for evaluation.  Past Medical History:  Diagnosis Date  . Arthritis   . Chronic kidney disease   . Depression   . Diabetes mellitus without complication (HCC)   . GERD (gastroesophageal reflux disease)   . Lymphedema   . Parkinson disease (HCC)   . Peripheral vascular disease [facility](HCC)     Patient Active Problem List   Diagnosis Date Noted  . End stage renal disease (HCC) 11/21/2015  . Complication of vascular access for dialysis 11/21/2015  . Pain in limb 11/21/2015  . Essential hypertension 11/21/2015    Past Surgical History:  Procedure Laterality Date  . CHOLECYSTECTOMY    . DIALYSIS FISTULA CREATION    . PERIPHERAL VASCULAR CATHETERIZATION Left 12/06/2014   Procedure: A/V Shuntogram/Fistulagram;  Surgeon: Annice NeedyJason S Dew, MD;  Location: [facility]  INVASIVE CV LAB;  Service: Cardiovascular;  Laterality: Left;  . PERIPHERAL VASCULAR CATHETERIZATION N/A 12/06/2014   Procedure: A/V Shunt Intervention;  Surgeon: Annice NeedyJason S Dew, MD;  Location: [facility] INVASIVE CV LAB;  Service: Cardiovascular;  Laterality: N/A;  . PERIPHERAL VASCULAR CATHETERIZATION Left 11/23/2015   Procedure: A/V Shuntogram/Fistulagram;  Surgeon: Renford DillsGregory G Schnier, MD;  Location: [facility] INVASIVE CV LAB;  Service: Cardiovascular;  Laterality: Left;    Prior to Admission medications   Medication Sig Start Date End Date Taking? Authorizing Provider  acetaminophen (TYLENOL) 650 MG CR tablet Take 1,300 mg by mouth 2 (two) times daily.    Historical Provider, MD  albuterol (PROVENTIL) (2.5 MG/3ML) 0.083% nebulizer solution Take 2.5 mg by nebulization every 6 (six) hours as needed for wheezing or shortness of breath.    Historical Provider, MD  budesonide-formoterol (SYMBICORT) 160-4.5 MCG/ACT inhaler Inhale 2 puffs into the lungs 2 (two) times daily.    Historical Provider, MD  calcium acetate (PHOSLO) 667 MG capsule Take 1 capsule by mouth 3 (three) times daily with meals. Pt does not always take 3 capsules  - just depends on her meals for the day 09/05/15   Historical Provider, MD  cinacalcet (SENSIPAR) 30 MG tablet Take 30 mg by mouth daily.    Historical Provider, MD  donepezil (ARICEPT) 10 MG tablet Take 10 mg by mouth at bedtime.  07/31/15   Historical Provider, MD  esomeprazole (NEXIUM) 40 MG capsule Take 40 mg by mouth daily at 12 noon.  Historical Provider, MD  gabapentin (NEURONTIN) 100 MG capsule Take 100 mg by mouth daily.     Historical Provider, MD  glipiZIDE (GLUCOTROL) 10 MG tablet Take 10 mg by mouth 2 (two) times daily.    Historical Provider, MD  memantine (NAMENDA) 5 MG tablet Take 5 mg by mouth 2 (two) times daily.    Historical Provider, MD  PARoxetine (PAXIL) 10 MG tablet Take 10 mg by mouth daily.    Historical Provider, MD  pioglitazone (ACTOS) 45 MG tablet Take 1  tablet by mouth once daily 07/12/15   Historical Provider, MD  pregabalin (LYRICA) 150 MG capsule Take 150 mg by mouth daily.     Historical Provider, MD  traZODone (DESYREL) 50 MG tablet Take 50 mg by mouth at bedtime.    Historical Provider, MD    Allergies  Allergen Reactions  . Metformin     Other reaction(s): Other (See Comments) GI and renal problems  . Midodrine     Insomnia   . Tape Other (See Comments)    Rash, PT uses paper tape  . Levaquin [Levofloxacin In D5w] Rash    Family History  Problem Relation Age of Onset  . Diabetes Father   . Cancer Sister   . Heart failure Sister   . Brain cancer Brother     Social History Social History  Substance Use Topics  . Smoking status: Never Smoker  . Smokeless tobacco: Never Used  . Alcohol use No    Review of Systems Constitutional: Negative for fever. Cardiovascular: Negative for chest pain. Respiratory: Negative for shortness of breath. Gastrointestinal: Negative for abdominal pain Musculoskeletal: Right shoulder pain Neurological: Negative for headache 10-point ROS otherwise negative.  ____________________________________________   PHYSICAL EXAM:  VITAL SIGNS: ED Triage Vitals  Enc Vitals Group     BP --      Pulse Rate 01/11/16 1140 88     Resp 01/11/16 1140 18     Temp 01/11/16 1140 100.3 F (37.9 C)     Temp Source 01/11/16 1140 Oral     SpO2 01/11/16 1140 93 %     Weight 01/11/16 1140 200 lb (90.7 kg)     Height 01/11/16 1140 5\' 2"  (1.575 m)     Head Circumference --      Peak Flow --      Pain Score 01/11/16 1153 8     Pain Loc --      Pain Edu? --      Excl. in GC? --     Constitutional: Alert and oriented. Well appearing and in no distress. Eyes: Normal exam ENT   Head: Normocephalic and atraumatic.   Mouth/Throat: Mucous membranes are moist.No pharyngeal erythema. Cardiovascular: Normal rate, regular rhythm. No murmur Respiratory: Normal respiratory effort without tachypnea nor  retractions. Breath sounds are clear. Occasional cough. Gastrointestinal: Soft and nontender. No distention.   Musculoskeletal: Nontender with normal range of motion in all extremities.  Neurologic:  Normal speech and language. No gross focal neurologic deficits Skin:  Skin is warm, dry and intact.  Psychiatric: Mood and affect are normal.   ____________________________________________   RADIOLOGY  Chest x-ray shows airway disease/bronchitis. No focal opacity. Right shoulder x-ray shows potential calcific tendinitis.  ____________________________________________   INITIAL IMPRESSION / ASSESSMENT AND PLAN / ED COURSE  Pertinent labs & imaging results that were available during my care of the patient were reviewed by me and considered in my medical decision making (see chart for details).  Patient presents  the emergency department with 1 week of right shoulder pain. Patient's discomfort is extremely reproducible with palpation or with movement. Neurovascularly intact distally with good grip strength. Denies any chest pain. Patient's x-ray shows possible tendinitis, an MRI is recommended for further evaluation. Patient does have a borderline temperature of 100.3. States cough and congestion, currently on amoxicillin prescribed by her primary care doctor. Vitals are largely within normal limits oxygen saturations currently 93-98 percent on room air. No distress. Patient doesn't mild rhinorrhea and an occasional cough. Suspect likely upper respiratory infection. We will prescribe Norco for pain relief. Patient will follow-up with orthopedics regarding her continued right shoulder pain. I highly suspect the patient is suffering from an upper respiratory infection causing her borderline fever. Patient will follow up with her primary care doctor.  ____________________________________________   FINAL CLINICAL IMPRESSION(S) / ED DIAGNOSES  Upper respiratory infection Right shoulder pain     Minna AntisKevin Mikeisha Lemonds, MD 01/11/16 1256

## 2016-01-11 NOTE — ED Notes (Signed)
Pt given graham crackers and juice

## 2016-01-11 NOTE — ED Triage Notes (Signed)
[facility]  Reports pain in right shoulder x1 wk after having a bp taken at dialysis.  Also reports sob off and on.  No sob at this time. Skin w/d.

## 2016-01-11 NOTE — ED Notes (Signed)
Pt refused bp or blood work at triage.

## 2016-01-11 NOTE — ED Notes (Signed)
Pt discharge w/ daughter and son.  Pt left in wheelchair, R arm sling applied. NAD.

## 2016-01-11 NOTE — ED Notes (Signed)
ED Provider at bedside. 

## 2016-01-11 NOTE — ED Notes (Signed)
EDP at bedside  

## 2016-01-18 ENCOUNTER — Encounter (INDEPENDENT_AMBULATORY_CARE_PROVIDER_SITE_OTHER): Payer: Self-pay | Admitting: Vascular Surgery

## 2016-01-18 ENCOUNTER — Other Ambulatory Visit (INDEPENDENT_AMBULATORY_CARE_PROVIDER_SITE_OTHER): Payer: Medicare Other

## 2016-01-18 ENCOUNTER — Ambulatory Visit (INDEPENDENT_AMBULATORY_CARE_PROVIDER_SITE_OTHER): Payer: Medicare Other | Admitting: Vascular Surgery

## 2016-01-18 ENCOUNTER — Other Ambulatory Visit (INDEPENDENT_AMBULATORY_CARE_PROVIDER_SITE_OTHER): Payer: Self-pay | Admitting: Vascular Surgery

## 2016-01-18 VITALS — Resp 16 | Wt 189.0 lb

## 2016-01-18 DIAGNOSIS — N186 End stage renal disease: Secondary | ICD-10-CM | POA: Diagnosis not present

## 2016-01-18 DIAGNOSIS — T8249XA Other complication of vascular dialysis catheter, initial encounter: Secondary | ICD-10-CM | POA: Diagnosis not present

## 2016-01-18 DIAGNOSIS — T829XXS Unspecified complication of cardiac and vascular prosthetic device, implant and graft, sequela: Secondary | ICD-10-CM | POA: Diagnosis not present

## 2016-01-18 DIAGNOSIS — I10 Essential (primary) hypertension: Secondary | ICD-10-CM | POA: Diagnosis not present

## 2016-01-30 NOTE — Progress Notes (Signed)
Subjective:    Patient ID: Renee Daugherty, female    DOB: 1926/10/30, 83 y.o.   MRN: [id number] Chief Complaint  Patient presents with  . Follow-up   Patient presents for his first post-procedure follow up. He is s/p a fistulogram on 11/23/15 for a poorly functioning fistula Since the intervention that patient has seen an improvement in his fistula performance. The patient underwent a duplex ultrasound of the AV access which was notable for a patent fistula without any significant hemodynamic stenosis. (2.1cm dilatation just proximal to anastomosis noted). Patient reports his hemodialysis doppler flow is increased. The patient denies any issues with hemodialysis such as cannulation problems, increased bleeding, decrease in doppler flow or recirculation. The patient also denies any fistula skin breakdown, pain, edema, pallor or ulceration of the arm / hand.    Review of Systems  Constitutional: Negative.   HENT: Negative.   Eyes: Negative.   Respiratory: Negative.   Cardiovascular: Negative.   Gastrointestinal: Negative.   Endocrine: Negative.   Genitourinary: Negative.   Musculoskeletal: Negative.   Skin: Negative.   Allergic/Immunologic: Negative.   Neurological: Negative.   Hematological: Negative.   Psychiatric/Behavioral: Negative.        Objective:   Physical Exam  Constitutional: She is oriented to person, place, and time. She appears well-developed and well-nourished.  HENT:  Head: Normocephalic and atraumatic.  Right Ear: External ear normal.  Left Ear: External ear normal.  Eyes: Conjunctivae and EOM are normal. Pupils are equal, round, and reactive to light.  Neck: Normal range of motion.  Cardiovascular: Normal rate, regular rhythm, normal heart sounds and intact distal pulses.   Pulses:      Radial pulses are 2+ on the right side, and 2+ on the left side.       Dorsalis pedis pulses are 2+ on the right side, and 2+ on the left side.       Posterior tibial pulses  are 2+ on the right side, and 2+ on the left side.  Left Upper Extremity AV Access: Good bruit and thrill noted.   Pulmonary/Chest: Effort normal and breath sounds normal.  Abdominal: Soft. Bowel sounds are normal.  Musculoskeletal: Normal range of motion. She exhibits no edema.  Neurological: She is alert and oriented to person, place, and time.  Skin: Skin is warm and dry.  Psychiatric: She has a normal mood and affect. Her behavior is normal. Judgment and thought content normal.   Resp 16   Wt 189 lb (85.7 kg)   BMI 34.57 kg/m   Past Medical History:  Diagnosis Date  . Arthritis   . Chronic kidney disease   . Depression   . Diabetes mellitus without complication (HCC)   . GERD (gastroesophageal reflux disease)   . Lymphedema   . Parkinson disease (HCC)   . Peripheral vascular disease [facility])    Social History   Social History  . Marital status: Widowed    Spouse name: N/A  . Number of children: N/A  . Years of education: N/A   Occupational History  . Not on file.   Social History Main Topics  . Smoking status: Never Smoker  . Smokeless tobacco: Never Used  . Alcohol use No  . Drug use: No  . Sexual activity: Not on file   Other Topics Concern  . Not on file   Social History Narrative  . No narrative on file   Past Surgical History:  Procedure Laterality Date  . CHOLECYSTECTOMY    .  DIALYSIS FISTULA CREATION    . PERIPHERAL VASCULAR CATHETERIZATION Left 12/06/2014   Procedure: A/V Shuntogram/Fistulagram;  Surgeon: Annice NeedyJason S Dew, MD;  Location: [facility] INVASIVE CV LAB;  Service: Cardiovascular;  Laterality: Left;  . PERIPHERAL VASCULAR CATHETERIZATION N/A 12/06/2014   Procedure: A/V Shunt Intervention;  Surgeon: Annice NeedyJason S Dew, MD;  Location: [facility] INVASIVE CV LAB;  Service: Cardiovascular;  Laterality: N/A;  . PERIPHERAL VASCULAR CATHETERIZATION Left 11/23/2015   Procedure: A/V Shuntogram/Fistulagram;  Surgeon: Renford DillsGregory G Schnier, MD;  Location: [facility] INVASIVE CV LAB;   Service: Cardiovascular;  Laterality: Left;   Family History  Problem Relation Age of Onset  . Diabetes Father   . Cancer Sister   . Heart failure Sister   . Brain cancer Brother     Allergies  Allergen Reactions  . Metformin     Other reaction(s): Other (See Comments) GI and renal problems  . Midodrine     Insomnia   . Tape Other (See Comments)    Rash, PT uses paper tape  . Levaquin [Levofloxacin In D5w] Rash      Assessment & Plan:  Patient presents for his first post-procedure follow up. He is s/p a fistulogram on 11/23/15 for a poorly functioning fistula Since the intervention that patient has seen an improvement in his fistula performance. The patient underwent a duplex ultrasound of the AV access which was notable for a patent fistula without any significant hemodynamic stenosis. (2.1cm dilatation just proximal to anastomosis noted). Patient reports his hemodialysis doppler flow is increased. The patient denies any issues with hemodialysis such as cannulation problems, increased bleeding, decrease in doppler flow or recirculation. The patient also denies any fistula skin breakdown, pain, edema, pallor or ulceration of the arm / hand.   1. End stage renal disease (HCC) - Stable Studies reviewed with patient. The patient is doing well and currently has adequate dialysis access. Duplex ultrasound of the AV access shows a patent access with no evidence of hemodynamically significant strictures or stenosis.  The patient should continue to have duplex ultrasounds of the dialysis access every sixmonths. The patient was instructed to call the office in the interim if any issues with dialysis access / doppler flow, pain, edema, pallor, fistula skin breakdown or ulceration of the arm / hand occur. The patient expressed their understanding.  2. Complication of vascular access for dialysis, sequela - Improved Studies reviewed with patient. The patient is doing well and currently has  adequate dialysis access. Duplex ultrasound of the AV access shows a patent access with no evidence of hemodynamically significant strictures or stenosis.  The patient should continue to have duplex ultrasounds of the dialysis access every sixmonths. The patient was instructed to call the office in the interim if any issues with dialysis access / doppler flow, pain, edema, pallor, fistula skin breakdown or ulceration of the arm / hand occur. The patient expressed their understanding  3. Essential hypertension - Stabe Encouraged good control as its slows the progression of atherosclerotic disease  Current Outpatient Prescriptions on File Prior to Visit  Medication Sig Dispense Refill  . acetaminophen (TYLENOL) 650 MG CR tablet Take 1,300 mg by mouth 2 (two) times daily.    Marland Kitchen. albuterol (PROVENTIL) (2.5 MG/3ML) 0.083% nebulizer solution Take 2.5 mg by nebulization every 6 (six) hours as needed for wheezing or shortness of breath.    . budesonide-formoterol (SYMBICORT) 160-4.5 MCG/ACT inhaler Inhale 2 puffs into the lungs 2 (two) times daily.    . calcium acetate (PHOSLO) 667 MG  capsule Take 1 capsule by mouth 3 (three) times daily with meals. Pt does not always take 3 capsules  - just depends on her meals for the day    . cinacalcet (SENSIPAR) 30 MG tablet Take 30 mg by mouth daily.    Marland Kitchen donepezil (ARICEPT) 10 MG tablet Take 10 mg by mouth at bedtime.     Marland Kitchen esomeprazole (NEXIUM) 40 MG capsule Take 40 mg by mouth daily at 12 noon.    . gabapentin (NEURONTIN) 100 MG capsule Take 100 mg by mouth daily.     Marland Kitchen glipiZIDE (GLUCOTROL) 10 MG tablet Take 10 mg by mouth 2 (two) times daily.    Marland Kitchen HYDROcodone-acetaminophen (NORCO/VICODIN) 5-325 MG tablet Take 1 tablet by mouth every 4 (four) hours as needed. 15 tablet 0  . memantine (NAMENDA) 5 MG tablet Take 5 mg by mouth 2 (two) times daily.    Marland Kitchen PARoxetine (PAXIL) 10 MG tablet Take 10 mg by mouth daily.    . pioglitazone (ACTOS) 45 MG tablet Take 1 tablet  by mouth once daily    . pregabalin (LYRICA) 150 MG capsule Take 150 mg by mouth daily.     . traZODone (DESYREL) 50 MG tablet Take 50 mg by mouth at bedtime.     No current facility-administered medications on file prior to visit.     There are no Patient Instructions on file for this visit. No Follow-up on file.   Loyce Klasen A Lexxus Underhill, PA-C

## 2016-03-08 ENCOUNTER — Encounter (INDEPENDENT_AMBULATORY_CARE_PROVIDER_SITE_OTHER): Payer: Self-pay | Admitting: Vascular Surgery

## 2016-03-08 ENCOUNTER — Ambulatory Visit (INDEPENDENT_AMBULATORY_CARE_PROVIDER_SITE_OTHER): Payer: Medicare Other | Admitting: Vascular Surgery

## 2016-03-08 VITALS — BP 136/51 | HR 94 | Resp 16 | Wt 193.0 lb

## 2016-03-08 DIAGNOSIS — T829XXS Unspecified complication of cardiac and vascular prosthetic device, implant and graft, sequela: Secondary | ICD-10-CM | POA: Diagnosis not present

## 2016-03-08 DIAGNOSIS — N186 End stage renal disease: Secondary | ICD-10-CM | POA: Diagnosis not present

## 2016-03-08 DIAGNOSIS — M79602 Pain in left arm: Secondary | ICD-10-CM | POA: Diagnosis not present

## 2016-03-08 DIAGNOSIS — I10 Essential (primary) hypertension: Secondary | ICD-10-CM | POA: Diagnosis not present

## 2016-03-14 NOTE — Progress Notes (Signed)
MRN : [id number]  Renee Daugherty is a 83 y.o. ([birth year]) female who presents with chief complaint of  Chief Complaint  Patient presents with  . Follow-up  .  History of Present Illness: The patient returns to the office for follow up regarding problem with the dialysis access. Currently the patient is maintained via a left brachial cephalic fistula.    The patient denies  a significant increase in bleeding time after decannulation.  The patient denies an increased recirculation.    The patient denies hand pain or other symptoms consistent with steal phenomena.  No significant arm swelling.  The patient denies redness or swelling at the access site. The patient denies fever or chills at home or while on dialysis.  The patient denies amaurosis fugax or recent TIA symptoms. There are no recent neurological changes noted. The patient denies claudication symptoms or rest pain symptoms. The patient denies history of DVT, PE or superficial thrombophlebitis. The patient denies recent episodes of angina or shortness of breath.     Current Meds  Medication Sig  . acetaminophen (TYLENOL) 650 MG CR tablet Take 1,300 mg by mouth 2 (two) times daily.  Marland Kitchen albuterol (PROVENTIL) (2.5 MG/3ML) 0.083% nebulizer solution Take 2.5 mg by nebulization every 6 (six) hours as needed for wheezing or shortness of breath.  . budesonide-formoterol (SYMBICORT) 160-4.5 MCG/ACT inhaler Inhale 2 puffs into the lungs 2 (two) times daily.  . calcium acetate (PHOSLO) 667 MG capsule Take 1 capsule by mouth 3 (three) times daily with meals. Pt does not always take 3 capsules  - just depends on her meals for the day  . cinacalcet (SENSIPAR) 30 MG tablet Take 30 mg by mouth daily.  Marland Kitchen donepezil (ARICEPT) 10 MG tablet Take 10 mg by mouth at bedtime.   Marland Kitchen esomeprazole (NEXIUM) 40 MG capsule Take 40 mg by mouth daily at 12 noon.  . gabapentin (NEURONTIN) 100 MG capsule Take 100 mg by mouth daily.   Marland Kitchen glipiZIDE (GLUCOTROL) 10  MG tablet Take 10 mg by mouth 2 (two) times daily.  Marland Kitchen HYDROcodone-acetaminophen (NORCO/VICODIN) 5-325 MG tablet Take 1 tablet by mouth every 4 (four) hours as needed.  . memantine (NAMENDA) 5 MG tablet Take 5 mg by mouth 2 (two) times daily.  Marland Kitchen PARoxetine (PAXIL) 10 MG tablet Take 10 mg by mouth daily.  . pioglitazone (ACTOS) 45 MG tablet Take 1 tablet by mouth once daily  . pregabalin (LYRICA) 150 MG capsule Take 150 mg by mouth daily.   . traZODone (DESYREL) 50 MG tablet Take 50 mg by mouth at bedtime.    Past Medical History:  Diagnosis Date  . Arthritis   . Chronic kidney disease   . Depression   . Diabetes mellitus without complication (HCC)   . GERD (gastroesophageal reflux disease)   . Lymphedema   . Parkinson disease (HCC)   . Peripheral vascular disease [facility])     Past Surgical History:  Procedure Laterality Date  . CHOLECYSTECTOMY    . DIALYSIS FISTULA CREATION    . PERIPHERAL VASCULAR CATHETERIZATION Left 12/06/2014   Procedure: A/V Shuntogram/Fistulagram;  Surgeon: Annice Needy, MD;  Location: [facility] INVASIVE CV LAB;  Service: Cardiovascular;  Laterality: Left;  . PERIPHERAL VASCULAR CATHETERIZATION N/A 12/06/2014   Procedure: A/V Shunt Intervention;  Surgeon: Annice Needy, MD;  Location: [facility] INVASIVE CV LAB;  Service: Cardiovascular;  Laterality: N/A;  . PERIPHERAL VASCULAR CATHETERIZATION Left 11/23/2015   Procedure: A/V Shuntogram/Fistulagram;  Surgeon: Renford Dills,  MD;  Location: [facility] INVASIVE CV LAB;  Service: Cardiovascular;  Laterality: Left;    Social History Social History  Substance Use Topics  . Smoking status: Never Smoker  . Smokeless tobacco: Never Used  . Alcohol use No    Family History Family History  Problem Relation Age of Onset  . Diabetes Father   . Cancer Sister   . Heart failure Sister   . Brain cancer Brother   No family history of bleeding/clotting disorders, porphyria or autoimmune disease  Allergies  Allergen Reactions  .  Metformin     Other reaction(s): Other (See Comments) GI and renal problems  . Midodrine     Insomnia   . Tape Other (See Comments)    Rash, PT uses paper tape  . Levaquin [Levofloxacin In D5w] Rash     REVIEW OF SYSTEMS (Negative unless checked)  Constitutional: [] Weight loss  [] Fever  [] Chills Cardiac: [] Chest pain   [] Chest pressure   [] Palpitations   [] Shortness of breath when laying flat   [] Shortness of breath with exertion. Vascular:  [] Pain in legs with walking   [] Pain in legs at rest  [] History of DVT   [] Phlebitis   [] Swelling in legs   [] Varicose veins   [] Non-healing ulcers Pulmonary:   [] Uses home oxygen   [] Productive cough   [] Hemoptysis   [] Wheeze  [] COPD   [] Asthma Neurologic:  [] Dizziness   [] Seizures   [] History of stroke   [] History of TIA  [] Aphasia   [] Vissual changes   [] Weakness or numbness in arm   [] Weakness or numbness in leg Musculoskeletal:   [] Joint swelling   [] Joint pain   [] Low back pain Hematologic:  [] Easy bruising  [] Easy bleeding   [] Hypercoagulable state   [] Anemic Gastrointestinal:  [] Diarrhea   [] Vomiting  [] Gastroesophageal reflux/heartburn   [] Difficulty swallowing. Genitourinary:  [x] Chronic kidney disease   [] Difficult urination  [] Frequent urination   [] Blood in urine Skin:  [] Rashes   [] Ulcers  Psychological:  [] History of anxiety   []  History of major depression.  Physical Examination  Vitals:   03/08/16 1641  BP: (!) 136/51  Pulse: 94  Resp: 16  Weight: 193 lb (87.5 kg)   Body mass index is 35.3 kg/m. Gen: WD/WN, NAD Head: [place]/AT, No temporalis wasting.  Ear/Nose/Throat: Hearing grossly intact, nares w/o erythema or drainage, poor dentition Eyes: PER, EOMI, sclera nonicteric.  Neck: Supple, no masses.  No bruit or JVD.  Pulmonary:  Good air movement, clear to auscultation bilaterally, no use of accessory muscles.  Cardiac: RRR, normal S1, S2, no Murmurs. Vascular: left arm fistula with several areas of skin thinning and  irritation Vessel Right Left  Radial Palpable Palpable  Ulnar Palpable Palpable  Brachial Palpable Palpable  Carotid Palpable Palpable  Gastrointestinal: soft, non-distended. No guarding/no peritoneal signs.  Musculoskeletal: M/S 5/5 throughout.  No deformity or atrophy.  Neurologic: CN 2-12 intact. Pain and light touch intact in extremities.  Symmetrical.  Speech is fluent. Motor exam as listed above. Psychiatric: Judgment intact, Mood & affect appropriate for pt's clinical situation. Dermatologic: No rashes or ulcers noted.  No changes consistent with cellulitis. Lymph : No Cervical lymphadenopathy, no lichenification or skin changes of chronic lymphedema.  CBC Lab Results  Component Value Date   WBC 12.4 (H) 01/11/2016   HGB 14.6 01/11/2016   HCT 44.2 01/11/2016   MCV 96.6 01/11/2016   PLT 248 01/11/2016    BMET    Component Value Date/Time   NA 135 01/11/2016 1211  NA 137 04/07/2014 1451   K 5.3 (H) 01/11/2016 1211   K 4.3 04/07/2014 1451   CL 94 (L) 01/11/2016 1211   CL 94 (L) 04/07/2014 1451   CO2 25 01/11/2016 1211   CO2 31 04/07/2014 1451   GLUCOSE 337 (H) 01/11/2016 1211   GLUCOSE 165 (H) 04/07/2014 1451   BUN 39 (H) 01/11/2016 1211   BUN 25 (H) 04/07/2014 1451   CREATININE 6.61 (H) 01/11/2016 1211   CREATININE 6.02 (H) 04/07/2014 1451   CALCIUM 8.9 01/11/2016 1211   CALCIUM 9.3 04/07/2014 1451   GFRNONAA 5 (L) 01/11/2016 1211   GFRNONAA 6 (L) 04/07/2014 1451   GFRAA 6 (L) 01/11/2016 1211   GFRAA 7 (L) 04/07/2014 1451   CrCl cannot be calculated (Patient's most recent lab result is older than the maximum 21 days allowed.).  COAG Lab Results  Component Value Date   INR 1.0 04/07/2014    Radiology No results found.  Assessment/Plan 1. Complication of vascular access for dialysis, sequela Recommend:  The patient is doing well and currently has adequate dialysis access. The patient's dialysis center is not reporting any access issues. Flow pattern  is stable when compared to the prior ultrasound.  Given the skin changes I will Rx Silvadene and watch these areas  The patient should follow up in 1 month to assess these areas.  The patient will follow-up with me in the office   2. End stage renal disease (HCC) Continue dialysis as arranged  3. Essential hypertension Continue antihypertensive medications as already ordered, these medications have been reviewed and there are no changes at this time.  4. Pain of left upper extremity Continue to use the fistula for now.  Future revision was discussed but the patient wishes to continue with the current access    Levora Dredge, MD  03/14/2016 9:35 PM

## 2016-03-19 ENCOUNTER — Ambulatory Visit (INDEPENDENT_AMBULATORY_CARE_PROVIDER_SITE_OTHER): Payer: Medicare Other | Admitting: Vascular Surgery

## 2016-03-19 ENCOUNTER — Encounter (INDEPENDENT_AMBULATORY_CARE_PROVIDER_SITE_OTHER): Payer: Self-pay | Admitting: Vascular Surgery

## 2016-03-19 VITALS — BP 118/58 | HR 106 | Resp 16 | Ht 62.0 in | Wt 196.0 lb

## 2016-03-19 DIAGNOSIS — N186 End stage renal disease: Secondary | ICD-10-CM

## 2016-03-19 DIAGNOSIS — T829XXS Unspecified complication of cardiac and vascular prosthetic device, implant and graft, sequela: Secondary | ICD-10-CM

## 2016-03-19 DIAGNOSIS — I10 Essential (primary) hypertension: Secondary | ICD-10-CM

## 2016-03-19 NOTE — Progress Notes (Signed)
MRN : [id number]  Renee Daugherty is a 83 y.o. (July 20, 1926) female who presents with chief complaint of  Chief Complaint  Patient presents with  . Re-evaluation    Wound check left arm  .  History of Present Illness: The patient returns to the office for follow up regarding problem with the dialysis access. Currently the patient is maintained via a left brachial cephalic fistula.    The patient denies  a significant increase in bleeding time after decannulation.  The patient denies an increased recirculation.    The patient denies hand pain or other symptoms consistent with steal phenomena.  No significant arm swelling.  The patient denies redness or swelling at the access site. The patient denies fever or chills at home or while on dialysis.  Current Meds  Medication Sig  . acetaminophen (TYLENOL) 650 MG CR tablet Take 1,300 mg by mouth 2 (two) times daily.  Marland Kitchen. albuterol (PROVENTIL) (2.5 MG/3ML) 0.083% nebulizer solution Take 2.5 mg by nebulization every 6 (six) hours as needed for wheezing or shortness of breath.  . budesonide-formoterol (SYMBICORT) 160-4.5 MCG/ACT inhaler Inhale 2 puffs into the lungs 2 (two) times daily.  . calcium acetate (PHOSLO) 667 MG capsule Take 1 capsule by mouth 3 (three) times daily with meals. Pt does not always take 3 capsules  - just depends on her meals for the day  . cinacalcet (SENSIPAR) 30 MG tablet Take 30 mg by mouth daily.  Marland Kitchen. donepezil (ARICEPT) 10 MG tablet Take 10 mg by mouth at bedtime.   Marland Kitchen. esomeprazole (NEXIUM) 40 MG capsule Take 40 mg by mouth daily at 12 noon.  . gabapentin (NEURONTIN) 100 MG capsule Take 100 mg by mouth daily.   Marland Kitchen. glipiZIDE (GLUCOTROL) 10 MG tablet Take 10 mg by mouth 2 (two) times daily.  Marland Kitchen. HYDROcodone-acetaminophen (NORCO/VICODIN) 5-325 MG tablet Take 1 tablet by mouth every 4 (four) hours as needed.  . memantine (NAMENDA) 5 MG tablet Take 5 mg by mouth 2 (two) times daily.  Marland Kitchen. PARoxetine (PAXIL) 10 MG tablet Take 10 mg  by mouth daily.  . pioglitazone (ACTOS) 45 MG tablet Take 1 tablet by mouth once daily  . pregabalin (LYRICA) 150 MG capsule Take 150 mg by mouth daily.   . traZODone (DESYREL) 50 MG tablet Take 50 mg by mouth at bedtime.    Past Medical History:  Diagnosis Date  . Arthritis   . Chronic kidney disease   . Depression   . Diabetes mellitus without complication (HCC)   . GERD (gastroesophageal reflux disease)   . Lymphedema   . Parkinson disease (HCC)   . Peripheral vascular disease [facility](HCC)     Past Surgical History:  Procedure Laterality Date  . CHOLECYSTECTOMY    . DIALYSIS FISTULA CREATION    . PERIPHERAL VASCULAR CATHETERIZATION Left 12/06/2014   Procedure: A/V Shuntogram/Fistulagram;  Surgeon: Annice NeedyJason S Dew, MD;  Location: [facility] INVASIVE CV LAB;  Service: Cardiovascular;  Laterality: Left;  . PERIPHERAL VASCULAR CATHETERIZATION N/A 12/06/2014   Procedure: A/V Shunt Intervention;  Surgeon: Annice NeedyJason S Dew, MD;  Location: [facility] INVASIVE CV LAB;  Service: Cardiovascular;  Laterality: N/A;  . PERIPHERAL VASCULAR CATHETERIZATION Left 11/23/2015   Procedure: A/V Shuntogram/Fistulagram;  Surgeon: Renford DillsGregory G Xochilt Conant, MD;  Location: [facility] INVASIVE CV LAB;  Service: Cardiovascular;  Laterality: Left;    Social History Social History  Substance Use Topics  . Smoking status: Never Smoker  . Smokeless tobacco: Never Used  . Alcohol use No  Family History Family History  Problem Relation Age of Onset  . Diabetes Father   . Cancer Sister   . Heart failure Sister   . Brain cancer Brother   No family history of bleeding/clotting disorders, porphyria or autoimmune disease   Allergies  Allergen Reactions  . Metformin     Other reaction(s): Other (See Comments) GI and renal problems  . Midodrine     Insomnia   . Tape Other (See Comments)    Rash, PT uses paper tape  . Levaquin [Levofloxacin In D5w] Rash     REVIEW OF SYSTEMS (Negative unless checked)  Constitutional: [] Weight loss   [] Fever  [] Chills Cardiac: [] Chest pain   [] Chest pressure   [] Palpitations   [] Shortness of breath when laying flat   [] Shortness of breath with exertion. Vascular:  [] Pain in legs with walking   [] Pain in legs at rest  [] History of DVT   [] Phlebitis   [] Swelling in legs   [] Varicose veins   [] Non-healing ulcers Pulmonary:   [] Uses home oxygen   [] Productive cough   [] Hemoptysis   [] Wheeze  [] COPD   [] Asthma Neurologic:  [] Dizziness   [] Seizures   [] History of stroke   [] History of TIA  [] Aphasia   [] Vissual changes   [] Weakness or numbness in arm   [] Weakness or numbness in leg Musculoskeletal:   [] Joint swelling   [] Joint pain   [] Low back pain Hematologic:  [] Easy bruising  [] Easy bleeding   [] Hypercoagulable state   [] Anemic Gastrointestinal:  [] Diarrhea   [] Vomiting  [] Gastroesophageal reflux/heartburn   [] Difficulty swallowing. Genitourinary:  [x] Chronic kidney disease   [] Difficult urination  [] Frequent urination   [] Blood in urine Skin:  [x] Rashes   [x] Ulcers  Psychological:  [] History of anxiety   []  History of major depression.  Physical Examination  Vitals:   03/19/16 1308  BP: (!) 118/58  Pulse: (!) 106  Resp: 16  Weight: 88.9 kg (196 lb)  Height: 5\' 2"  (1.575 m)   Body mass index is 35.85 kg/m. Gen: WD/WN, NAD Head: [place]/AT, No temporalis wasting.  Ear/Nose/Throat: Hearing grossly intact, nares w/o erythema or drainage, poor dentition Eyes: PER, EOMI, sclera nonicteric.  Neck: Supple, no masses.  No bruit or JVD.  Pulmonary:  Good air movement, clear to auscultation bilaterally, no use of accessory muscles.  Cardiac: RRR, normal S1, S2, no Murmurs. Vascular: left arm fistula with several areas previously noted to have skin thinning and irritation, this has now resolved.  The lateral wound is nearly healed Vessel Right Left  Radial Palpable Palpable  Ulnar Palpable Palpable  Brachial Palpable Palpable  Carotid Palpable Palpable  Gastrointestinal: soft, non-distended.  No guarding/no peritoneal signs.  Musculoskeletal: M/S 4/5 lower extremities.  Presents in a wheelchair  No deformity or atrophy.  Neurologic: CN 2-12 intact. Pain and light touch intact in extremities.  Symmetrical.  Speech is fluent. Motor exam as listed above. Psychiatric: Judgment intact, Mood & affect appropriate for pt's clinical situation. Dermatologic: No rashes or ulcers noted.  No changes consistent with cellulitis. Lymph : No Cervical lymphadenopathy, no lichenification or skin changes of chronic lymphedema.  CBC Lab Results  Component Value Date   WBC 12.4 (H) 01/11/2016   HGB 14.6 01/11/2016   HCT 44.2 01/11/2016   MCV 96.6 01/11/2016   PLT 248 01/11/2016    BMET    Component Value Date/Time   NA 135 01/11/2016 1211   NA 137 04/07/2014 1451   K 5.3 (H) 01/11/2016 1211   K  4.3 04/07/2014 1451   CL 94 (L) 01/11/2016 1211   CL 94 (L) 04/07/2014 1451   CO2 25 01/11/2016 1211   CO2 31 04/07/2014 1451   GLUCOSE 337 (H) 01/11/2016 1211   GLUCOSE 165 (H) 04/07/2014 1451   BUN 39 (H) 01/11/2016 1211   BUN 25 (H) 04/07/2014 1451   CREATININE 6.61 (H) 01/11/2016 1211   CREATININE 6.02 (H) 04/07/2014 1451   CALCIUM 8.9 01/11/2016 1211   CALCIUM 9.3 04/07/2014 1451   GFRNONAA 5 (L) 01/11/2016 1211   GFRNONAA 6 (L) 04/07/2014 1451   GFRAA 6 (L) 01/11/2016 1211   GFRAA 7 (L) 04/07/2014 1451   CrCl cannot be calculated (Patient's most recent lab result is older than the maximum 21 days allowed.).  COAG Lab Results  Component Value Date   INR 1.0 04/07/2014    Radiology No results found.  Assessment/Plan 1. Complication of vascular access for dialysis, sequela Continue silvadene cream to the lateral skin wound  Continue to access the AV fistula as they have been doing and avoid the areas that were previously damaged  2. End stage renal disease (HCC) Continue dialysis without interruption AV fistula is adequate access  3. Essential hypertension Continue  antihypertensive medications as already ordered, these medications have been reviewed and there are no changes at this time.     Levora Dredge, MD  03/19/2016 1:13 PM

## 2016-06-18 ENCOUNTER — Encounter (INDEPENDENT_AMBULATORY_CARE_PROVIDER_SITE_OTHER): Payer: Self-pay | Admitting: Vascular Surgery

## 2016-06-18 ENCOUNTER — Ambulatory Visit (INDEPENDENT_AMBULATORY_CARE_PROVIDER_SITE_OTHER): Payer: Medicare Other | Admitting: Vascular Surgery

## 2016-06-18 ENCOUNTER — Encounter (INDEPENDENT_AMBULATORY_CARE_PROVIDER_SITE_OTHER): Payer: Self-pay

## 2016-06-18 ENCOUNTER — Ambulatory Visit (INDEPENDENT_AMBULATORY_CARE_PROVIDER_SITE_OTHER): Payer: Medicare Other

## 2016-06-18 VITALS — BP 123/57 | HR 68 | Resp 15 | Ht 62.5 in | Wt 198.0 lb

## 2016-06-18 DIAGNOSIS — N186 End stage renal disease: Secondary | ICD-10-CM

## 2016-06-18 DIAGNOSIS — I10 Essential (primary) hypertension: Secondary | ICD-10-CM

## 2016-06-18 DIAGNOSIS — T829XXS Unspecified complication of cardiac and vascular prosthetic device, implant and graft, sequela: Secondary | ICD-10-CM | POA: Diagnosis not present

## 2016-06-18 NOTE — Progress Notes (Signed)
Subjective:    Patient ID: Renee Daugherty, female    DOB: 01-07-1927, 83 y.o.   MRN: [id number] Chief Complaint  Patient presents with  . Re-evaluation    Ultrasound follow up   Patient presents for a six-month HDA follow-up. Seen with daughter. The patient complaining of prolonged bleeding after treatments. The patient underwent a left upper extremity AV fistula duplex which was notable for an area of increased narrowing possibly located proximal upper arm (60 -> 530). Distal arm with aneurysmal area however no skin threatening noted. Daughter states patient's fistula is about 81 years old.    Review of Systems  Constitutional: Negative.   HENT: Negative.   Eyes: Negative.   Respiratory: Negative.   Cardiovascular: Negative.   Gastrointestinal: Negative.   Endocrine: Negative.   Genitourinary:       ESRD  Musculoskeletal: Negative.   Skin: Negative.   Allergic/Immunologic: Negative.   Neurological: Negative.   Hematological: Negative.   Psychiatric/Behavioral: Negative.       Objective:   Physical Exam  Constitutional: She is oriented to person, place, and time. She appears well-developed and well-nourished. No distress.  HENT:  Head: Normocephalic and atraumatic.  Eyes: Conjunctivae are normal. Pupils are equal, round, and reactive to light.  Neck: Normal range of motion.  Cardiovascular: Normal rate, regular rhythm, normal heart sounds and intact distal pulses.   Pulses:      Radial pulses are 2+ on the right side, and 2+ on the left side.  Left upper extremity access: Pulsatile distal aneurysmal area with no skin threatening.   Pulmonary/Chest: Effort normal.  Musculoskeletal: Normal range of motion. She exhibits no edema.  Neurological: She is alert and oriented to person, place, and time.  Skin: Skin is warm and dry. She is not diaphoretic.  Psychiatric: She has a normal mood and affect. Her behavior is normal. Judgment and thought content normal.  Vitals  reviewed.  BP (!) 123/57 (BP Location: Right Arm)   Pulse 68   Resp 15   Ht 5' 2.5" (1.588 m)   Wt 198 lb (89.8 kg)   BMI 35.64 kg/m   Past Medical History:  Diagnosis Date  . Arthritis   . Chronic kidney disease   . Depression   . Diabetes mellitus without complication (HCC)   . GERD (gastroesophageal reflux disease)   . Lymphedema   . Parkinson disease (HCC)   . Peripheral vascular disease [facility])     Social History   Social History  . Marital status: Widowed    Spouse name: N/A  . Number of children: N/A  . Years of education: N/A   Occupational History  . Not on file.   Social History Main Topics  . Smoking status: Never Smoker  . Smokeless tobacco: Never Used  . Alcohol use No  . Drug use: No  . Sexual activity: Not on file   Other Topics Concern  . Not on file   Social History Narrative  . No narrative on file    Past Surgical History:  Procedure Laterality Date  . CHOLECYSTECTOMY    . DIALYSIS FISTULA CREATION    . PERIPHERAL VASCULAR CATHETERIZATION Left 12/06/2014   Procedure: A/V Shuntogram/Fistulagram;  Surgeon: Annice Needy, MD;  Location: [facility] INVASIVE CV LAB;  Service: Cardiovascular;  Laterality: Left;  . PERIPHERAL VASCULAR CATHETERIZATION N/A 12/06/2014   Procedure: A/V Shunt Intervention;  Surgeon: Annice Needy, MD;  Location: [facility] INVASIVE CV LAB;  Service: Cardiovascular;  Laterality: N/A;  .  PERIPHERAL VASCULAR CATHETERIZATION Left 11/23/2015   Procedure: A/V Shuntogram/Fistulagram;  Surgeon: Renford DillsGregory G Schnier, MD;  Location: [facility] INVASIVE CV LAB;  Service: Cardiovascular;  Laterality: Left;    Family History  Problem Relation Age of Onset  . Diabetes Father   . Cancer Sister   . Heart failure Sister   . Brain cancer Brother     Allergies  Allergen Reactions  . Metformin     Other reaction(s): Other (See Comments) GI and renal problems  . Midodrine     Insomnia   . Tape Other (See Comments)    Rash, PT uses paper tape  .  Levaquin [Levofloxacin In D5w] Rash       Assessment & Plan:  Patient presents for a six-month HDA follow-up. Seen with daughter. The patient complaining of prolonged bleeding after treatments. The patient underwent a left upper extremity AV fistula duplex which was notable for an area of increased narrowing possibly located proximal upper arm (60 -> 530). Distal arm with aneurysmal area however no skin threatening noted. Daughter states patient's fistula is about 81 years old.   1. End stage renal disease (HCC) - Stable As below  2. Complication of vascular access for dialysis, sequela - new Patient with prolonged bleeding during dialysis treatments. Area of significant narrowing within fistula.  When compared to HDA in January 2018 this area of narrowing is new.  Where some for the eventual loss of fistula function. Recommend left upper extremity fistulogram with possible intervention. Procedure risk and benefits explained to patient and daughter. All questions answered. Patient and daughter wished to proceed  3. Essential hypertension - stable  Encouraged good control as its slows the progression of atherosclerotic disease  Current Outpatient Prescriptions on File Prior to Visit  Medication Sig Dispense Refill  . acetaminophen (TYLENOL) 650 MG CR tablet Take 1,300 mg by mouth 2 (two) times daily.    Marland Kitchen. albuterol (PROVENTIL) (2.5 MG/3ML) 0.083% nebulizer solution Take 2.5 mg by nebulization every 6 (six) hours as needed for wheezing or shortness of breath.    . budesonide-formoterol (SYMBICORT) 160-4.5 MCG/ACT inhaler Inhale 2 puffs into the lungs 2 (two) times daily.    . calcium acetate (PHOSLO) 667 MG capsule Take 1 capsule by mouth 3 (three) times daily with meals. Pt does not always take 3 capsules  - just depends on her meals for the day    . cinacalcet (SENSIPAR) 30 MG tablet Take 30 mg by mouth daily.    Marland Kitchen. donepezil (ARICEPT) 10 MG tablet Take 10 mg by mouth at bedtime.     Marland Kitchen.  esomeprazole (NEXIUM) 40 MG capsule Take 40 mg by mouth daily at 12 noon.    . gabapentin (NEURONTIN) 100 MG capsule Take 100 mg by mouth daily.     Marland Kitchen. glipiZIDE (GLUCOTROL) 10 MG tablet Take 10 mg by mouth 2 (two) times daily.    Marland Kitchen. HYDROcodone-acetaminophen (NORCO/VICODIN) 5-325 MG tablet Take 1 tablet by mouth every 4 (four) hours as needed. 15 tablet 0  . memantine (NAMENDA) 5 MG tablet Take 5 mg by mouth 2 (two) times daily.    Marland Kitchen. PARoxetine (PAXIL) 10 MG tablet Take 10 mg by mouth daily.    . pioglitazone (ACTOS) 45 MG tablet Take 1 tablet by mouth once daily    . pregabalin (LYRICA) 150 MG capsule Take 150 mg by mouth daily.     . traZODone (DESYREL) 50 MG tablet Take 50 mg by mouth at bedtime.  No current facility-administered medications on file prior to visit.     There are no Patient Instructions on file for this visit. No Follow-up on file.   Adelyn Roscher A Ulisses Vondrak, PA-C

## 2016-06-25 ENCOUNTER — Other Ambulatory Visit (INDEPENDENT_AMBULATORY_CARE_PROVIDER_SITE_OTHER): Payer: Self-pay | Admitting: Vascular Surgery

## 2016-06-25 MED ORDER — CEFAZOLIN SODIUM-DEXTROSE 1-4 GM/50ML-% IV SOLN
1.0000 g | Freq: Once | INTRAVENOUS | Status: AC
  Administered 2016-06-26: 1 g via INTRAVENOUS

## 2016-06-26 ENCOUNTER — Encounter: Payer: Self-pay | Admitting: Vascular Surgery

## 2016-06-26 ENCOUNTER — Ambulatory Visit
Admission: RE | Admit: 2016-06-26 | Discharge: 2016-06-26 | Disposition: A | Discharge status: Home / Self Care | Payer: Medicare Other | Source: Ambulatory Visit | Attending: Vascular Surgery | Admitting: Vascular Surgery

## 2016-06-26 ENCOUNTER — Encounter
Admission: RE | Disposition: A | Discharge status: Home / Self Care | Payer: Self-pay | Source: Ambulatory Visit | Attending: Vascular Surgery

## 2016-06-26 DIAGNOSIS — Y832 Surgical operation with anastomosis, bypass or graft as the cause of abnormal reaction of the patient, or of later complication, without mention of misadventure at the time of the procedure: Secondary | ICD-10-CM | POA: Insufficient documentation

## 2016-06-26 DIAGNOSIS — Z809 Family history of malignant neoplasm, unspecified: Secondary | ICD-10-CM | POA: Diagnosis not present

## 2016-06-26 DIAGNOSIS — Z9889 Other specified postprocedural states: Secondary | ICD-10-CM | POA: Insufficient documentation

## 2016-06-26 DIAGNOSIS — Z881 Allergy status to other antibiotic agents status: Secondary | ICD-10-CM | POA: Insufficient documentation

## 2016-06-26 DIAGNOSIS — I890 Lymphedema, not elsewhere classified: Secondary | ICD-10-CM | POA: Diagnosis not present

## 2016-06-26 DIAGNOSIS — E1122 Type 2 diabetes mellitus with diabetic chronic kidney disease: Secondary | ICD-10-CM | POA: Diagnosis not present

## 2016-06-26 DIAGNOSIS — K219 Gastro-esophageal reflux disease without esophagitis: Secondary | ICD-10-CM | POA: Diagnosis not present

## 2016-06-26 DIAGNOSIS — Z808 Family history of malignant neoplasm of other organs or systems: Secondary | ICD-10-CM | POA: Insufficient documentation

## 2016-06-26 DIAGNOSIS — G20 Parkinson's disease: Secondary | ICD-10-CM | POA: Insufficient documentation

## 2016-06-26 DIAGNOSIS — M1990 Unspecified osteoarthritis, unspecified site: Secondary | ICD-10-CM | POA: Diagnosis not present

## 2016-06-26 DIAGNOSIS — Z9049 Acquired absence of other specified parts of digestive tract: Secondary | ICD-10-CM | POA: Insufficient documentation

## 2016-06-26 DIAGNOSIS — I120 Hypertensive chronic kidney disease with stage 5 chronic kidney disease or end stage renal disease: Secondary | ICD-10-CM | POA: Insufficient documentation

## 2016-06-26 DIAGNOSIS — Z833 Family history of diabetes mellitus: Secondary | ICD-10-CM | POA: Diagnosis not present

## 2016-06-26 DIAGNOSIS — Z7984 Long term (current) use of oral hypoglycemic drugs: Secondary | ICD-10-CM | POA: Diagnosis not present

## 2016-06-26 DIAGNOSIS — Z8249 Family history of ischemic heart disease and other diseases of the circulatory system: Secondary | ICD-10-CM | POA: Diagnosis not present

## 2016-06-26 DIAGNOSIS — Z9109 Other allergy status, other than to drugs and biological substances: Secondary | ICD-10-CM | POA: Diagnosis not present

## 2016-06-26 DIAGNOSIS — Z992 Dependence on renal dialysis: Secondary | ICD-10-CM | POA: Insufficient documentation

## 2016-06-26 DIAGNOSIS — T82858A Stenosis of vascular prosthetic devices, implants and grafts, initial encounter: Secondary | ICD-10-CM | POA: Diagnosis not present

## 2016-06-26 DIAGNOSIS — Z888 Allergy status to other drugs, medicaments and biological substances status: Secondary | ICD-10-CM | POA: Diagnosis not present

## 2016-06-26 DIAGNOSIS — T82868A Thrombosis of vascular prosthetic devices, implants and grafts, initial encounter: Secondary | ICD-10-CM | POA: Diagnosis not present

## 2016-06-26 DIAGNOSIS — N186 End stage renal disease: Secondary | ICD-10-CM | POA: Diagnosis not present

## 2016-06-26 HISTORY — PX: A/V FISTULAGRAM: CATH118298

## 2016-06-26 LAB — GLUCOSE, CAPILLARY
GLUCOSE-CAPILLARY: 106 mg/dL — AB (ref 65–99)
Glucose-Capillary: 100 mg/dL — ABNORMAL HIGH (ref 65–99)

## 2016-06-26 LAB — POTASSIUM (ARMC VASCULAR LAB ONLY): POTASSIUM (ARMC VASCULAR LAB): 4.2 (ref 3.5–5.1)

## 2016-06-26 SURGERY — A/V FISTULAGRAM
Anesthesia: Moderate Sedation | Laterality: Left

## 2016-06-26 MED ORDER — ONDANSETRON HCL 4 MG/2ML IJ SOLN: 4.0000 mg | Freq: Four times a day (QID) | INTRAMUSCULAR | Status: DC | PRN

## 2016-06-26 MED ORDER — FENTANYL CITRATE (PF) 100 MCG/2ML IJ SOLN
INTRAMUSCULAR | Status: DC | PRN
  Administered 2016-06-26: 25 ug via INTRAVENOUS
  Administered 2016-06-26: 50 ug via INTRAVENOUS

## 2016-06-26 MED ORDER — FENTANYL CITRATE (PF) 100 MCG/2ML IJ SOLN
INTRAMUSCULAR | Status: AC
  Filled 2016-06-26: qty 2

## 2016-06-26 MED ORDER — HEPARIN SODIUM (PORCINE) 1000 UNIT/ML IJ SOLN
INTRAMUSCULAR | Status: AC
  Filled 2016-06-26: qty 1

## 2016-06-26 MED ORDER — SODIUM CHLORIDE 0.9 % IV SOLN: INTRAVENOUS | Status: DC

## 2016-06-26 MED ORDER — MIDAZOLAM HCL 2 MG/2ML IJ SOLN
INTRAMUSCULAR | Status: DC | PRN
  Administered 2016-06-26: 2 mg via INTRAVENOUS

## 2016-06-26 MED ORDER — FAMOTIDINE 20 MG PO TABS: 40.0000 mg | ORAL_TABLET | ORAL | Status: DC | PRN

## 2016-06-26 MED ORDER — HEPARIN SODIUM (PORCINE) 1000 UNIT/ML IJ SOLN
INTRAMUSCULAR | Status: DC | PRN
Start: 2016-06-26 — End: 2016-06-26
  Administered 2016-06-26: 3000 [IU] via INTRAVENOUS

## 2016-06-26 MED ORDER — METHYLPREDNISOLONE SODIUM SUCC 125 MG IJ SOLR: 125.0000 mg | INTRAMUSCULAR | Status: DC | PRN

## 2016-06-26 MED ORDER — HYDROMORPHONE HCL 1 MG/ML IJ SOLN: 1.0000 mg | Freq: Once | INTRAMUSCULAR | Status: DC | PRN

## 2016-06-26 MED ORDER — MIDAZOLAM HCL 2 MG/2ML IJ SOLN
INTRAMUSCULAR | Status: AC
  Filled 2016-06-26: qty 2

## 2016-06-26 MED ORDER — IOPAMIDOL (ISOVUE-300) INJECTION 61%
INTRAVENOUS | Status: DC | PRN
  Administered 2016-06-26: 55 mL via INTRA_ARTERIAL

## 2016-06-26 MED ORDER — LIDOCAINE HCL (PF) 1 % IJ SOLN
INTRAMUSCULAR | Status: AC
  Filled 2016-06-26: qty 10

## 2016-06-26 MED ORDER — HEPARIN (PORCINE) IN NACL 2-0.9 UNIT/ML-% IJ SOLN
INTRAMUSCULAR | Status: AC
  Filled 2016-06-26: qty 1000

## 2016-06-26 SURGICAL SUPPLY — 22 items
BALLN LUTONIX AV 12X40X75 (BALLOONS) ×3
BALLN LUTONIX AV 8X40X75 (BALLOONS) ×3
BALLN LUTONIX DCB 6X40X130 (BALLOONS) ×3
BALLN ULTRASCORE 6X40X130 (BALLOONS) ×3
BALLOON LUTONIX AV 12X40X75 (BALLOONS) ×1 IMPLANT
BALLOON LUTONIX AV 8X40X75 (BALLOONS) ×1 IMPLANT
BALLOON LUTONIX DCB 6X40X130 (BALLOONS) ×1 IMPLANT
BALLOON ULTRASCORE 6X40X130 (BALLOONS) ×1 IMPLANT
CATH BEACON 5 .035 40 KMP TP (CATHETERS) ×1 IMPLANT
CATH BEACON 5 .038 40 KMP TP (CATHETERS) ×2
COVER PROBE U/S 5X48 (MISCELLANEOUS) ×3 IMPLANT
DEVICE PRESTO INFLATION (MISCELLANEOUS) ×3 IMPLANT
DRAPE BRACHIAL (DRAPES) ×3 IMPLANT
GUIDEWIRE ANGLED .035 180CM (WIRE) ×6 IMPLANT
NEEDLE ENTRY 21GA 7CM ECHOTIP (NEEDLE) ×3 IMPLANT
PACK ANGIOGRAPHY (CUSTOM PROCEDURE TRAY) ×3 IMPLANT
SET INTRO CAPELLA COAXIAL (SET/KITS/TRAYS/PACK) ×3 IMPLANT
SHEATH BRITE TIP 6FRX5.5 (SHEATH) ×3 IMPLANT
SHEATH BRITE TIP 7FRX5.5 (SHEATH) ×3 IMPLANT
STENT LIFESTAR 14X40 (Permanent Stent) ×3 IMPLANT
TOWEL OR 17X26 4PK STRL BLUE (TOWEL DISPOSABLE) ×3 IMPLANT
WIRE MAGIC TORQUE 260C (WIRE) ×3 IMPLANT

## 2016-06-26 NOTE — H&P (Signed)
[facility] History & Physical Update  The patient was interviewed and re-examined.  The patient's previous History and Physical has been reviewed and is unchanged.  There is no change in the plan of care. We plan to proceed with the scheduled procedure.  Levora DredgeGregory Schnier, MD  06/26/2016, 11:17 AM

## 2016-06-26 NOTE — Op Note (Signed)
OPERATIVE NOTE   PROCEDURE: 1. Contrast injection left arm brachiocephalic  AV access 2. Percutaneous transluminal angioplasty peripheral segment to 8 mm left arm brachiocephalic using a Lutonix drug-eluting balloon 3. Percutaneous transluminal angioplasty and stent placement left subclavian vein 12 mm postdilated with a Lutonix drug-eluting balloon  PRE-OPERATIVE DIAGNOSIS: Complication of dialysis access                                                       End Stage Renal Disease  POST-OPERATIVE DIAGNOSIS: same as above   SURGEON: Katha Cabal, M.D.  ANESTHESIA: Conscious sedation was administered under my direct supervision by the interventional radiology RN. IV Versed plus fentanyl were utilized. Continuous ECG, pulse oximetry and blood pressure was monitored throughout the entire procedure.  Conscious sedation was for a total of 37.  ESTIMATED BLOOD LOSS: minimal  FINDING(S): Stricture of the AV graft within the peripheral segment as well as a stricture within the central venous portion  SPECIMEN(S):  None  CONTRAST: 63 cc  FLUOROSCOPY TIME: 55 minutes  INDICATIONS: Renee Daugherty is a 83 y.o. female who  presents with malfunctioning left brachiocephalic AV access.  The patient is scheduled for angiography with possible intervention of the AV access.  The patient is aware the risks include but are not limited to: bleeding, infection, thrombosis of the cannulated access, and possible anaphylactic reaction to the contrast.  The patient acknowledges if the access can not be salvaged a tunneled catheter will be needed and will be placed during this procedure.  The patient is aware of the risks of the procedure and elects to proceed with the angiogram and intervention.  DESCRIPTION: After full informed written consent was obtained, the patient was brought back to the Special Procedure suite and placed supine position.  Appropriate cardiopulmonary monitors were placed.  The left  arm was prepped and draped in the standard fashion.  Appropriate timeout is called. The left brachiocephalic AV fistula  was cannulated with a micropuncture needle.  Cannulation was performed with ultrasound guidance. Ultrasound was placed in a sterile sleeve, the AV access was interrogated and noted to be echolucent and compressible indicating patency. Image was recorded for the permanent record. The puncture is performed under continuous ultrasound visualization.   The microwire was advanced and the needle was exchanged for  a microsheath.  The J-wire was then advanced and a 6 Fr sheath inserted.  Hand injections were completed to image the access from the arterial anastomosis through the entire access.  The central venous structures were also imaged by hand injections.  Based on the images,  3000 units of heparin was given and a wire was negotiated through the strictures within the venous portion of the graft as well as the central stenosis. The sheath was then upsized to a 7 [place] sheath.  An 6 mm ultra score balloon was used to predilate the lesion within the subclavian vein on the left.  Inflation was to 14 atm for 1 minute.  Follow-up angiography demonstrated modest improvement and a 14 x 40 life*stent was then deployed across the lesion. The stent was then postdilated with a 12 mm x 40 mm Lutonix drug-eluting balloon.   Follow-up imaging demonstrated 0% residual stenosis.   The detector was then repositioned over the peripheral portion of the AV access and the  6 mm ultra score balloon was used to treat the peripheral stricture within the AV access. Inflation was to 14 atm for 1 minutes.  Next, 2 inflations were used initially a 6 mm Lutonix balloon and then an 8 mm x 40 mm Lutonix drug-eluting balloon was utilized to treat this area. Both inflations were 212 atm for 1 minute. Follow-up imaging demonstrated 0 residual stenosis.  Follow-up imaging demonstrates significant improvement with a marked  reduction of the strictures.  There is now rapid flow of contrast through the graft and the central veins.   A 4-0 Monocryl purse-string suture was sewn around the sheath.  The sheath was removed and light pressure was applied.  A sterile bandage was applied to the puncture site.    COMPLICATIONS: None  CONDITION: Renee Daugherty, M.D [facility] Office: [phone]  06/26/2016 12:24 PM

## 2016-07-06 ENCOUNTER — Emergency Department: Payer: Medicare Other

## 2016-07-06 ENCOUNTER — Emergency Department
Admission: EM | Admit: 2016-07-06 | Discharge: 2016-07-06 | Disposition: A | Discharge status: Home / Self Care | Payer: Medicare Other | Attending: Emergency Medicine | Admitting: Emergency Medicine

## 2016-07-06 DIAGNOSIS — N186 End stage renal disease: Secondary | ICD-10-CM | POA: Insufficient documentation

## 2016-07-06 DIAGNOSIS — I120 Hypertensive chronic kidney disease with stage 5 chronic kidney disease or end stage renal disease: Secondary | ICD-10-CM | POA: Insufficient documentation

## 2016-07-06 DIAGNOSIS — E1122 Type 2 diabetes mellitus with diabetic chronic kidney disease: Secondary | ICD-10-CM | POA: Insufficient documentation

## 2016-07-06 DIAGNOSIS — M7989 Other specified soft tissue disorders: Secondary | ICD-10-CM | POA: Insufficient documentation

## 2016-07-06 DIAGNOSIS — Z79899 Other long term (current) drug therapy: Secondary | ICD-10-CM | POA: Diagnosis not present

## 2016-07-06 DIAGNOSIS — M79641 Pain in right hand: Secondary | ICD-10-CM | POA: Diagnosis present

## 2016-07-06 DIAGNOSIS — Z7984 Long term (current) use of oral hypoglycemic drugs: Secondary | ICD-10-CM | POA: Insufficient documentation

## 2016-07-06 DIAGNOSIS — G629 Polyneuropathy, unspecified: Secondary | ICD-10-CM | POA: Diagnosis not present

## 2016-07-06 DIAGNOSIS — G20 Parkinson's disease: Secondary | ICD-10-CM | POA: Insufficient documentation

## 2016-07-06 LAB — GLUCOSE, CAPILLARY: Glucose-Capillary: 116 mg/dL — ABNORMAL HIGH (ref 65–99)

## 2016-07-06 NOTE — Discharge Instructions (Signed)
You were evaluated for hand pain, and as we discussed, I am most suspicious of neuropathy causing the symptoms. Your exam and evaluation in the emergency department today was reassuring.  Return to the emergency department immediately for any worsening condition including swelling, redness, rash, fever, neck pain or chest pain, trouble breathing, or any other symptoms concerning to you.  Increase the gabapentin dose to 200mg  (2 of your 100mg  tablets) three times per day. You may continue Aleve one every 12 hours.

## 2016-07-06 NOTE — ED Provider Notes (Signed)
[facility] Emergency Department Provider Note ____________________________________________   I have reviewed the triage vital signs and the triage nursing note.  HISTORY  Chief Complaint Hand Pain   Historian Patient and daughter at bedside  HPI Renee Daugherty is a 83 y.o. female presents from home where she lives alone, but her daughter checks on her frequently. She has a history of diabetes, peripheral vascular disease, neuropathy, kidney disease with dialysis, as well as Parkinson's, and daughter states that she has chronic pain from head to toe. Patient states that this morning her right hand was hurting more than normal for several hours now. She states that her fingertips felt numb and tingly. Denies any weakness. She did not take any Aleve today although she would typically take that for pain. She did not take her morning medications including her gabapentin yet today.  She did have an IV in that hand within the past several weeks. She has chronic bruising and some chronic edema in the right arm. No new traumatic injury. She does have a fistula on the left upper extremity for dialysis.    Past Medical History:  Diagnosis Date  . Arthritis   . Chronic kidney disease   . Depression   . Diabetes mellitus without complication (HCC)   . GERD (gastroesophageal reflux disease)   . Lymphedema   . Parkinson disease (HCC)   . Peripheral vascular disease [facility])     Patient Active Problem List   Diagnosis Date Noted  . End stage renal disease (HCC) 11/21/2015  . Complication of vascular access for dialysis 11/21/2015  . Pain in limb 11/21/2015  . Essential hypertension 11/21/2015    Past Surgical History:  Procedure Laterality Date  . A/V SHUNTOGRAM Left 06/26/2016   Procedure: A/V Fistulagram;  Surgeon: Renford Dills, MD;  Location: [facility] INVASIVE CV LAB;  Service: Cardiovascular;  Laterality: Left;  . CHOLECYSTECTOMY    . DIALYSIS FISTULA CREATION     . PERIPHERAL VASCULAR CATHETERIZATION Left 12/06/2014   Procedure: A/V Shuntogram/Fistulagram;  Surgeon: Annice Needy, MD;  Location: [facility] INVASIVE CV LAB;  Service: Cardiovascular;  Laterality: Left;  . PERIPHERAL VASCULAR CATHETERIZATION N/A 12/06/2014   Procedure: A/V Shunt Intervention;  Surgeon: Annice Needy, MD;  Location: [facility] INVASIVE CV LAB;  Service: Cardiovascular;  Laterality: N/A;  . PERIPHERAL VASCULAR CATHETERIZATION Left 11/23/2015   Procedure: A/V Shuntogram/Fistulagram;  Surgeon: Renford Dills, MD;  Location: [facility] INVASIVE CV LAB;  Service: Cardiovascular;  Laterality: Left;    Prior to Admission medications   Medication Sig Start Date End Date Taking? Authorizing Provider  acetaminophen (TYLENOL) 650 MG CR tablet Take 1,300 mg by mouth 2 (two) times daily.    [provider]  albuterol (PROVENTIL) (2.5 MG/3ML) 0.083% nebulizer solution Take 2.5 mg by nebulization every 6 (six) hours as needed for wheezing or shortness of breath.    [provider]  budesonide-formoterol (SYMBICORT) 160-4.5 MCG/ACT inhaler Inhale 2 puffs into the lungs 2 (two) times daily.    [provider]  calcium acetate (PHOSLO) 667 MG capsule Take 1 capsule by mouth 3 (three) times daily with meals. Pt does not always take 3 capsules  - just depends on her meals for the day 09/05/15   [provider]  cinacalcet (SENSIPAR) 30 MG tablet Take 30 mg by mouth See admin instructions. Tues Thurs Sat - at bedtime    [provider]  donepezil (ARICEPT) 10 MG tablet Take 10 mg by mouth at  bedtime.  07/31/15   [provider]  esomeprazole (NEXIUM) 40 MG capsule Take 40 mg by mouth daily at 12 noon.    [provider]  gabapentin (NEURONTIN) 100 MG capsule Take 100 mg by mouth 2 (two) times daily.     [provider]  glipiZIDE (GLUCOTROL) 10 MG tablet Take 10 mg by mouth 2 (two) times daily.    [provider]  memantine (NAMENDA) 10  MG tablet Take 10 mg by mouth 2 (two) times daily.     [provider]  PARoxetine (PAXIL) 10 MG tablet Take 10 mg by mouth daily.    [provider]  pioglitazone (ACTOS) 45 MG tablet Take 1 tablet by mouth once daily 07/12/15   [provider]  pregabalin (LYRICA) 150 MG capsule Take 150 mg by mouth daily.     [provider]  traZODone (DESYREL) 50 MG tablet Take 100 mg by mouth at bedtime.     [provider]    Allergies  Allergen Reactions  . Metformin     Other reaction(s): Other (See Comments) GI and renal problems  . Midodrine     Insomnia   . Tape Other (See Comments)    Rash, PT uses paper tape  . Levaquin [Levofloxacin In D5w] Rash    Family History  Problem Relation Age of Onset  . Diabetes Father   . Cancer Sister   . Heart failure Sister   . Brain cancer Brother     Social History Social History  Substance Use Topics  . Smoking status: Never Smoker  . Smokeless tobacco: Never Used  . Alcohol use No    Review of Systems  Constitutional: Negative for any fevers. Eyes: Negative for visual changes. ENT: Negative for sore throat. Cardiovascular: Negative for chest pain. Respiratory: Negative for shortness of breath. Gastrointestinal: Negative for abdominal pain, vomiting and diarrhea. Genitourinary: Negative for dysuria. Musculoskeletal: Negative for back pain. Skin: Negative for rash, or redness. Neurological: Reported she had a headache earlier today but that's gone now.  ____________________________________________   PHYSICAL EXAM:  VITAL SIGNS: ED Triage Vitals  Enc Vitals Group     BP 07/06/16 0725 (!) 136/57     Pulse Rate 07/06/16 0724 82     Resp 07/06/16 0724 18     Temp 07/06/16 0724 97.8 F (36.6 C)     Temp Source 07/06/16 0724 Oral     SpO2 07/06/16 0724 100 %     Weight 07/06/16 0724 198 lb (89.8 kg)     Height 07/06/16 0724 5\' 2"  (1.575 m)     Head Circumference --      Peak Flow --       Pain Score 07/06/16 0723 10     Pain Loc --      Pain Edu? --      Excl. in GC? --      Constitutional: Alert and Cooperative, somewhat poor historian. No distress, but she does continue to state that she is hurting in her right hand and fingertips. HEENT   Head: Normocephalic and atraumatic.      Eyes: Conjunctivae are normal. Pupils equal and round.       Ears:         Nose: No congestion/rhinnorhea.   Mouth/Throat: Mucous membranes are moist.   Neck: No stridor. Cardiovascular/Chest: Normal rate, regular rhythm.  No murmurs, rubs, or gallops. Respiratory: Normal respiratory effort without tachypnea nor retractions. Breath sounds are clear and  equal bilaterally. No wheezes/rales/rhonchi. Gastrointestinal: Soft. No distention, no guarding, no rebound. Nontender.    Genitourinary/rectal:Deferred Musculoskeletal: Left upper extremity with good pulses and left vascular access. Right upper extremity with mild edema, some old appearing bruising and mild soft tissue tenderness not only at both arms but also both legs. 1+ lower extremity edema bilateral lower extremities which patient states is chronic.  Vascular intact with good pulses in both upper extremities. Neurologic:  Normal speech and language. Patient reports some tingling in her fingertips, but no additional sensory complaints on the rest of the arm. Full and equal strength 4 extremities. Skin:  Skin is warm, dry and intact. No rash noted. Psychiatric: Mood and affect are normal. Speech and behavior are normal. Patient exhibits appropriate insight and judgment.   ____________________________________________  LABS (pertinent positives/negatives)  Labs Reviewed  GLUCOSE, CAPILLARY - Abnormal; Notable for the following:       Result Value   Glucose-Capillary 116 (*)    All other components within normal limits    ____________________________________________    EKG I, Governor Rooks, MD, the attending physician  have personally viewed and interpreted all ECGs.  None ____________________________________________  RADIOLOGY All Xrays were viewed by me. Imaging interpreted by Radiologist.  Right upper extremity ultrasound:  IMPRESSION: No evidence of DVT within the right upper extremity.  __________________________________________  PROCEDURES  Procedure(s) performed: None  Critical Care performed: None  ____________________________________________   ED COURSE / ASSESSMENT AND PLAN  Pertinent labs & imaging results that were available during my care of the patient were reviewed by me and considered in my medical decision making (see chart for details).   Ms. Levada Schilling has chronic pain, but it sounds like the focal complaint into the right hand is a little unusual for her. On exam there is no clear cellulitis. There is no report of trauma or bony discomfort. Good pulses there. She does have some chronic skin discoloration as well as old appearing bruises as well as some mild edema in the right upper extremity and I did discuss with patient and family obtaining upper extremity ultrasound to rule out an abortion a DVT. She was initially complaining of some tingling there, but it does not seem like a different distribution for stroke. She has no weakness.  I'm most suspicious that she is probably having exacerbation of chronic pain with her neuropathy. Patient's daughter stated that her insurance company had refused to pay medications given in the emergency department at a previous visit, and so they would like to decline receiving any acute pain medication including Advil and gabapentin for her pain and would rather just take at home.   [place] reassuring.  Patient reevaluated, reports pain at fingertips without any new swelling or redness or skin rash.  We discussed increasing gabapentin dosing.    CONSULTATIONS:   None  Patient / Family / Caregiver informed of clinical course, medical  decision-making process, and agree with plan.   I discussed return precautions, follow-up instructions, and discharge instructions with patient and/or family.  Discharge Instructions : You were evaluated for hand pain, and as we discussed, I am most suspicious of neuropathy causing the symptoms. Your exam and evaluation in the emergency department today was reassuring.  Return to the emergency department immediately for any worsening condition including swelling, redness, rash, fever, neck pain or chest pain, trouble breathing, or any other symptoms concerning to you.  Increase the gabapentin dose to 200mg  (2 of your 100mg  tablets) three times per day. You may  continue Aleve one every 12 hours.  ___________________________________________   FINAL CLINICAL IMPRESSION(S) / ED DIAGNOSES   Final diagnoses:  Swelling of arm              Note: This dictation was prepared with Dragon dictation. Any transcriptional errors that result from this process are unintentional    Governor RooksLord, Zoya Sprecher, MD 07/06/16 1056

## 2016-07-06 NOTE — ED Notes (Addendum)
Right hand with +2 radial, cap refill <3 sec. Old bruising top of right hand. Pt states finger tips tingling. C/o HA, pt gcs 14. Perrl. LUA fistula with + bruie and thrill

## 2016-07-06 NOTE — ED Triage Notes (Signed)
Pt is here with daughter who states the pt has dementia and brought her here today for c/o HA. Pt states my head doesn't really hurts it my right hand that is causing me all the pain, pt is tearful in triage.

## 2016-07-23 ENCOUNTER — Encounter (INDEPENDENT_AMBULATORY_CARE_PROVIDER_SITE_OTHER): Payer: Self-pay

## 2016-07-23 ENCOUNTER — Other Ambulatory Visit (INDEPENDENT_AMBULATORY_CARE_PROVIDER_SITE_OTHER): Payer: Medicare Other

## 2016-07-23 ENCOUNTER — Ambulatory Visit (INDEPENDENT_AMBULATORY_CARE_PROVIDER_SITE_OTHER): Payer: Medicare Other | Admitting: Vascular Surgery

## 2016-07-23 ENCOUNTER — Other Ambulatory Visit (INDEPENDENT_AMBULATORY_CARE_PROVIDER_SITE_OTHER): Payer: Self-pay | Admitting: Vascular Surgery

## 2016-07-23 ENCOUNTER — Encounter (INDEPENDENT_AMBULATORY_CARE_PROVIDER_SITE_OTHER): Payer: Self-pay | Admitting: Vascular Surgery

## 2016-07-23 VITALS — BP 128/61 | HR 64 | Resp 15 | Ht 64.0 in | Wt 192.0 lb

## 2016-07-23 DIAGNOSIS — N186 End stage renal disease: Secondary | ICD-10-CM | POA: Diagnosis not present

## 2016-07-23 DIAGNOSIS — G20 Parkinson's disease: Secondary | ICD-10-CM

## 2016-07-23 DIAGNOSIS — T829XXS Unspecified complication of cardiac and vascular prosthetic device, implant and graft, sequela: Secondary | ICD-10-CM

## 2016-07-23 DIAGNOSIS — I10 Essential (primary) hypertension: Secondary | ICD-10-CM

## 2016-07-23 NOTE — Progress Notes (Signed)
MRN : [id number]  Renee Daugherty is a 83 y.o. ([birth year]) female who presents with chief complaint of  Chief Complaint  Patient presents with  . Re-evaluation    3 week [facility] follow up  .  History of Present Illness: The patient returns to the office for followup status post intervention of the dialysis access on 06/26/2016:  1. Contrast injection left arm brachiocephalic  AV access 2. Percutaneous transluminal angioplasty peripheral segment to 8 mm left arm brachiocephalic using a Lutonix drug-eluting balloon 3.   Percutaneous transluminal angioplasty and stent placement left subclavian vein 12 mm postdilated with a Lutonix drug-eluting balloon   Following the intervention the excess function was unchanged per the patient.  The patient continues to be experiencing increased bleeding times following decannulation and increased recirculation with diminished efficiency of their dialysis. The patient denies an increase in arm swelling. At the present time the patient denies hand pain.  The patient denies amaurosis fugax or recent TIA symptoms. There are no recent neurological changes noted. The patient denies claudication symptoms or rest pain symptoms. The patient denies history of DVT, PE or superficial thrombophlebitis. The patient denies recent episodes of angina or shortness of breath.   Duplex ultrasound of the AV access shows a patent access.  There appears to be a focal narrowing near the anastomosis.     Current Meds  Medication Sig  . acetaminophen (TYLENOL) 650 MG CR tablet Take 1,300 mg by mouth 2 (two) times daily.  Marland Kitchen albuterol (PROVENTIL) (2.5 MG/3ML) 0.083% nebulizer solution Take 2.5 mg by nebulization every 6 (six) hours as needed for wheezing or shortness of breath.  . budesonide-formoterol (SYMBICORT) 160-4.5 MCG/ACT inhaler Inhale 2 puffs into the lungs 2 (two) times daily.  . calcium acetate (PHOSLO) 667 MG capsule Take 1 capsule by mouth 3 (three) times daily with  meals. Pt does not always take 3 capsules  - just depends on her meals for the day  . carbidopa-levodopa (SINEMET CR) 50-200 MG tablet   . cinacalcet (SENSIPAR) 30 MG tablet Take 30 mg by mouth See admin instructions. Tues Thurs Sat - at bedtime  . donepezil (ARICEPT) 10 MG tablet Take 10 mg by mouth at bedtime.   Marland Kitchen esomeprazole (NEXIUM) 40 MG capsule Take 40 mg by mouth daily at 12 noon.  . gabapentin (NEURONTIN) 100 MG capsule Take 100 mg by mouth 2 (two) times daily.   Marland Kitchen glipiZIDE (GLUCOTROL) 10 MG tablet Take 10 mg by mouth 2 (two) times daily.  . memantine (NAMENDA) 10 MG tablet Take 10 mg by mouth 2 (two) times daily.   Marland Kitchen PARoxetine (PAXIL) 10 MG tablet Take 10 mg by mouth daily.  . pioglitazone (ACTOS) 45 MG tablet Take 1 tablet by mouth once daily  . pregabalin (LYRICA) 150 MG capsule Take 150 mg by mouth daily.   . sevelamer carbonate (RENVELA) 800 MG tablet   . traZODone (DESYREL) 50 MG tablet Take 100 mg by mouth at bedtime.     Past Medical History:  Diagnosis Date  . Arthritis   . Chronic kidney disease   . Depression   . Diabetes mellitus without complication (HCC)   . GERD (gastroesophageal reflux disease)   . Lymphedema   . Parkinson disease (HCC)   . Peripheral vascular disease [facility])     Past Surgical History:  Procedure Laterality Date  . A/V SHUNTOGRAM Left 06/26/2016   Procedure: A/V Fistulagram;  Surgeon: Renford Dills, MD;  Location: [facility] INVASIVE CV  LAB;  Service: Cardiovascular;  Laterality: Left;  . CHOLECYSTECTOMY    . DIALYSIS FISTULA CREATION    . PERIPHERAL VASCULAR CATHETERIZATION Left 12/06/2014   Procedure: A/V Shuntogram/Fistulagram;  Surgeon: Annice Needy, MD;  Location: [facility] INVASIVE CV LAB;  Service: Cardiovascular;  Laterality: Left;  . PERIPHERAL VASCULAR CATHETERIZATION N/A 12/06/2014   Procedure: A/V Shunt Intervention;  Surgeon: Annice Needy, MD;  Location: [facility] INVASIVE CV LAB;  Service: Cardiovascular;  Laterality: N/A;  . PERIPHERAL  VASCULAR CATHETERIZATION Left 11/23/2015   Procedure: A/V Shuntogram/Fistulagram;  Surgeon: Renford Dills, MD;  Location: [facility] INVASIVE CV LAB;  Service: Cardiovascular;  Laterality: Left;    Social History Social History  Substance Use Topics  . Smoking status: Never Smoker  . Smokeless tobacco: Never Used  . Alcohol use No    Family History Family History  Problem Relation Age of Onset  . Diabetes Father   . Cancer Sister   . Heart failure Sister   . Brain cancer Brother     Allergies  Allergen Reactions  . Metformin     Other reaction(s): Other (See Comments) GI and renal problems  . Midodrine     Insomnia   . Tape Other (See Comments)    Rash, PT uses paper tape  . Levaquin [Levofloxacin In D5w] Rash     REVIEW OF SYSTEMS (Negative unless checked)  Constitutional: [] Weight loss  [] Fever  [] Chills Cardiac: [] Chest pain   [] Chest pressure   [] Palpitations   [] Shortness of breath when laying flat   [] Shortness of breath with exertion. Vascular:  [] Pain in legs with walking   [] Pain in legs at rest  [] History of DVT   [] Phlebitis   [] Swelling in legs   [] Varicose veins   [] Non-healing ulcers Pulmonary:   [] Uses home oxygen   [] Productive cough   [] Hemoptysis   [] Wheeze  [] COPD   [] Asthma Neurologic:  [] Dizziness   [] Seizures   [] History of stroke   [] History of TIA  [] Aphasia   [] Vissual changes   [] Weakness or numbness in arm   [x] Weakness or numbness in leg Musculoskeletal:   [] Joint swelling   [] Joint pain   [] Low back pain Hematologic:  [] Easy bruising  [] Easy bleeding   [] Hypercoagulable state   [] Anemic Gastrointestinal:  [] Diarrhea   [] Vomiting  [] Gastroesophageal reflux/heartburn   [] Difficulty swallowing. Genitourinary:  [x] Chronic kidney disease   [] Difficult urination  [] Frequent urination   [] Blood in urine Skin:  [] Rashes   [] Ulcers  Psychological:  [] History of anxiety   []  History of major depression.  Physical Examination  Vitals:   07/23/16 1019   BP: 128/61  Pulse: 64  Resp: 15  Weight: 87.1 kg (192 lb)  Height: 5\' 4"  (1.626 m)   Body mass index is 32.96 kg/m. Gen: WD/WN, NAD Head: [place]/AT, No temporalis wasting.  Ear/Nose/Throat: Hearing grossly intact, nares w/o erythema or drainage Eyes: PER, EOMI, sclera nonicteric.  Neck: Supple, no large masses.   Pulmonary:  Good air movement, no audible wheezing bilaterally, no use of accessory muscles.  Cardiac: RRR, no JVD Vascular: left arm brachial cephalic fistula poor thrill and weak bruit Vessel Right Left  Radial Palpable Palpable  Ulnar Palpable Palpable  Brachial Palpable Palpable  Gastrointestinal: Non-distended. No guarding/no peritoneal signs.  Musculoskeletal: M/S 5/5 throughout.  No deformity or atrophy.  Neurologic: CN 2-12 intact. Symmetrical.  Speech is fluent. Motor exam as listed above. Psychiatric: Judgment intact, Mood & affect appropriate for pt's clinical situation. Dermatologic: No rashes or  ulcers noted.  No changes consistent with cellulitis. Lymph : No lichenification or skin changes of chronic lymphedema.  CBC Lab Results  Component Value Date   WBC 12.4 (H) 01/11/2016   HGB 14.6 01/11/2016   HCT 44.2 01/11/2016   MCV 96.6 01/11/2016   PLT 248 01/11/2016    BMET    Component Value Date/Time   NA 135 01/11/2016 1211   NA 137 04/07/2014 1451   K 5.3 (H) 01/11/2016 1211   K 4.3 04/07/2014 1451   CL 94 (L) 01/11/2016 1211   CL 94 (L) 04/07/2014 1451   CO2 25 01/11/2016 1211   CO2 31 04/07/2014 1451   GLUCOSE 337 (H) 01/11/2016 1211   GLUCOSE 165 (H) 04/07/2014 1451   BUN 39 (H) 01/11/2016 1211   BUN 25 (H) 04/07/2014 1451   CREATININE 6.61 (H) 01/11/2016 1211   CREATININE 6.02 (H) 04/07/2014 1451   CALCIUM 8.9 01/11/2016 1211   CALCIUM 9.3 04/07/2014 1451   GFRNONAA 5 (L) 01/11/2016 1211   GFRNONAA 6 (L) 04/07/2014 1451   GFRAA 6 (L) 01/11/2016 1211   GFRAA 7 (L) 04/07/2014 1451   CrCl cannot be calculated (Patient's most recent lab  result is older than the maximum 21 days allowed.).  COAG Lab Results  Component Value Date   INR 1.0 04/07/2014    Radiology [place] Venous Img Upper Uni Right  Result Date: 07/06/2016 CLINICAL DATA:  Swelling of right upper extremity for 3 weeks EXAM: RIGHT UPPER EXTREMITY VENOUS DOPPLER ULTRASOUND TECHNIQUE: Gray-scale sonography with graded compression, as well as color Doppler and duplex ultrasound were performed to evaluate the upper extremity deep venous system from the level of the subclavian vein and including the jugular, axillary, basilic, radial, ulnar and upper cephalic vein. Spectral Doppler was utilized to evaluate flow at rest and with distal augmentation maneuvers. COMPARISON:  None. FINDINGS: Contralateral Subclavian Vein: Respiratory phasicity is normal and symmetric with the symptomatic side. No evidence of thrombus. Normal compressibility. Internal Jugular Vein: No evidence of thrombus. Normal compressibility, respiratory phasicity and response to augmentation. Subclavian Vein: No evidence of thrombus. Normal compressibility, respiratory phasicity and response to augmentation. Axillary Vein: No evidence of thrombus. Normal compressibility, respiratory phasicity and response to augmentation. Cephalic Vein: No evidence of thrombus. Normal compressibility, respiratory phasicity and response to augmentation. Basilic Vein: No evidence of thrombus. Normal compressibility, respiratory phasicity and response to augmentation. Brachial Veins: No evidence of thrombus. Normal compressibility, respiratory phasicity and response to augmentation. Radial Veins: No evidence of thrombus. Normal compressibility, respiratory phasicity and response to augmentation. Ulnar Veins: No evidence of thrombus. Normal compressibility, respiratory phasicity and response to augmentation. Venous Reflux:  None visualized. Other Findings:  None visualized. IMPRESSION: No evidence of DVT within the right upper extremity.  Electronically Signed   By: Charlett NoseKevin  Dover M.D.   On: 07/06/2016 10:23     Assessment/Plan 1. Complication of vascular access for dialysis, sequela Recommend:  The patient is experiencing persistent problems with their dialysis access.  Patient should have a fistulagram with the intention for intervention.  The intention for intervention is to restore appropriate flow and prevent thrombosis and possible loss of the access.  As well as improve the quality of dialysis therapy.  The risks, benefits and alternative therapies were reviewed in detail with the patient.  All questions were answered.  The patient agrees to proceed with angio/intervention.    2. End stage renal disease (HCC) Continue dialysis as scheduled without interuption  3. Essential hypertension Continue  antihypertensive medications as already ordered, these medications have been reviewed and there are no changes at this time.   4. Parkinson's disease (HCC) Continue  Sinemet and Sensipar as already ordered, these medications have been reviewed and there are no changes at this time.     Levora Dredge, MD  07/23/2016 10:24 AM

## 2016-07-31 ENCOUNTER — Encounter
Admission: RE | Disposition: A | Discharge status: Home / Self Care | Payer: Self-pay | Source: Ambulatory Visit | Attending: Vascular Surgery

## 2016-07-31 ENCOUNTER — Encounter: Payer: Self-pay | Admitting: *Deleted

## 2016-07-31 ENCOUNTER — Ambulatory Visit
Admission: RE | Admit: 2016-07-31 | Discharge: 2016-07-31 | Disposition: A | Discharge status: Home / Self Care | Payer: Medicare Other | Source: Ambulatory Visit | Attending: Vascular Surgery | Admitting: Vascular Surgery

## 2016-07-31 ENCOUNTER — Other Ambulatory Visit (INDEPENDENT_AMBULATORY_CARE_PROVIDER_SITE_OTHER): Payer: Self-pay

## 2016-07-31 DIAGNOSIS — N186 End stage renal disease: Secondary | ICD-10-CM

## 2016-07-31 DIAGNOSIS — Z809 Family history of malignant neoplasm, unspecified: Secondary | ICD-10-CM | POA: Insufficient documentation

## 2016-07-31 DIAGNOSIS — Z9889 Other specified postprocedural states: Secondary | ICD-10-CM | POA: Diagnosis not present

## 2016-07-31 DIAGNOSIS — T82858A Stenosis of vascular prosthetic devices, implants and grafts, initial encounter: Secondary | ICD-10-CM | POA: Diagnosis present

## 2016-07-31 DIAGNOSIS — K219 Gastro-esophageal reflux disease without esophagitis: Secondary | ICD-10-CM | POA: Diagnosis not present

## 2016-07-31 DIAGNOSIS — Z8249 Family history of ischemic heart disease and other diseases of the circulatory system: Secondary | ICD-10-CM | POA: Diagnosis not present

## 2016-07-31 DIAGNOSIS — I890 Lymphedema, not elsewhere classified: Secondary | ICD-10-CM | POA: Diagnosis not present

## 2016-07-31 DIAGNOSIS — Y832 Surgical operation with anastomosis, bypass or graft as the cause of abnormal reaction of the patient, or of later complication, without mention of misadventure at the time of the procedure: Secondary | ICD-10-CM | POA: Diagnosis not present

## 2016-07-31 DIAGNOSIS — Z833 Family history of diabetes mellitus: Secondary | ICD-10-CM | POA: Diagnosis not present

## 2016-07-31 DIAGNOSIS — E1122 Type 2 diabetes mellitus with diabetic chronic kidney disease: Secondary | ICD-10-CM | POA: Diagnosis not present

## 2016-07-31 DIAGNOSIS — Z808 Family history of malignant neoplasm of other organs or systems: Secondary | ICD-10-CM | POA: Insufficient documentation

## 2016-07-31 DIAGNOSIS — I120 Hypertensive chronic kidney disease with stage 5 chronic kidney disease or end stage renal disease: Secondary | ICD-10-CM | POA: Insufficient documentation

## 2016-07-31 DIAGNOSIS — Z9049 Acquired absence of other specified parts of digestive tract: Secondary | ICD-10-CM | POA: Insufficient documentation

## 2016-07-31 DIAGNOSIS — M1990 Unspecified osteoarthritis, unspecified site: Secondary | ICD-10-CM | POA: Diagnosis not present

## 2016-07-31 DIAGNOSIS — Z888 Allergy status to other drugs, medicaments and biological substances status: Secondary | ICD-10-CM | POA: Insufficient documentation

## 2016-07-31 DIAGNOSIS — G20 Parkinson's disease: Secondary | ICD-10-CM | POA: Insufficient documentation

## 2016-07-31 DIAGNOSIS — Z881 Allergy status to other antibiotic agents status: Secondary | ICD-10-CM | POA: Insufficient documentation

## 2016-07-31 DIAGNOSIS — Z992 Dependence on renal dialysis: Secondary | ICD-10-CM | POA: Diagnosis not present

## 2016-07-31 DIAGNOSIS — T82868A Thrombosis of vascular prosthetic devices, implants and grafts, initial encounter: Secondary | ICD-10-CM | POA: Diagnosis not present

## 2016-07-31 DIAGNOSIS — Z9109 Other allergy status, other than to drugs and biological substances: Secondary | ICD-10-CM | POA: Diagnosis not present

## 2016-07-31 HISTORY — DX: Dyspnea, unspecified: R06.00

## 2016-07-31 HISTORY — PX: A/V FISTULAGRAM: CATH118298

## 2016-07-31 LAB — POTASSIUM (ARMC VASCULAR LAB ONLY): POTASSIUM (ARMC VASCULAR LAB): 4 (ref 3.5–5.1)

## 2016-07-31 LAB — GLUCOSE, CAPILLARY: Glucose-Capillary: 197 mg/dL — ABNORMAL HIGH (ref 65–99)

## 2016-07-31 SURGERY — A/V FISTULAGRAM
Anesthesia: Moderate Sedation | Laterality: Left

## 2016-07-31 MED ORDER — HEPARIN SODIUM (PORCINE) 1000 UNIT/ML IJ SOLN
INTRAMUSCULAR | Status: AC
  Filled 2016-07-31: qty 1

## 2016-07-31 MED ORDER — FENTANYL CITRATE (PF) 100 MCG/2ML IJ SOLN
INTRAMUSCULAR | Status: AC
  Filled 2016-07-31: qty 2

## 2016-07-31 MED ORDER — IOPAMIDOL (ISOVUE-300) INJECTION 61%
INTRAVENOUS | Status: DC | PRN
  Administered 2016-07-31: 30 mL via INTRA_ARTERIAL

## 2016-07-31 MED ORDER — MIDAZOLAM HCL 5 MG/5ML IJ SOLN
INTRAMUSCULAR | Status: AC
  Filled 2016-07-31: qty 5

## 2016-07-31 MED ORDER — CEFAZOLIN SODIUM-DEXTROSE 2-4 GM/100ML-% IV SOLN
2.0000 g | Freq: Once | INTRAVENOUS | Status: AC
  Administered 2016-07-31: 2 g via INTRAVENOUS

## 2016-07-31 MED ORDER — HEPARIN SODIUM (PORCINE) 1000 UNIT/ML IJ SOLN
INTRAMUSCULAR | Status: DC | PRN
  Administered 2016-07-31: 3000 [IU] via INTRAVENOUS

## 2016-07-31 MED ORDER — LIDOCAINE HCL (PF) 1 % IJ SOLN
INTRAMUSCULAR | Status: AC
  Filled 2016-07-31: qty 30

## 2016-07-31 MED ORDER — FENTANYL CITRATE (PF) 100 MCG/2ML IJ SOLN
INTRAMUSCULAR | Status: DC | PRN
  Administered 2016-07-31: 50 ug via INTRAVENOUS

## 2016-07-31 MED ORDER — SODIUM CHLORIDE 0.9 % IV SOLN
INTRAVENOUS | Status: DC
  Administered 2016-07-31: 10:00:00 via INTRAVENOUS

## 2016-07-31 MED ORDER — MIDAZOLAM HCL 2 MG/2ML IJ SOLN
INTRAMUSCULAR | Status: DC | PRN
  Administered 2016-07-31: 1 mg via INTRAVENOUS

## 2016-07-31 SURGICAL SUPPLY — 18 items
BALLN ARMADA 8X20X80 (BALLOONS) ×2
BALLN LUTONIX DCB 4X60X130 (BALLOONS) ×2
BALLN LUTONIX DCB 6X40X130 (BALLOONS) ×2
BALLOON ARMADA 8X20X80 (BALLOONS) ×1 IMPLANT
BALLOON LUTONIX DCB 4X60X130 (BALLOONS) ×1 IMPLANT
BALLOON LUTONIX DCB 6X40X130 (BALLOONS) ×1 IMPLANT
CATH BEACON 5 .035 40 KMP TP (CATHETERS) ×1 IMPLANT
CATH BEACON 5 .038 40 KMP TP (CATHETERS) ×1
DEVICE PRESTO INFLATION (MISCELLANEOUS) ×2 IMPLANT
DRAPE BRACHIAL (DRAPES) ×2 IMPLANT
GUIDEWIRE ANGLED .035 180CM (WIRE) ×2 IMPLANT
NEEDLE ENTRY 21GA 7CM ECHOTIP (NEEDLE) ×2 IMPLANT
PACK ANGIOGRAPHY (CUSTOM PROCEDURE TRAY) ×2 IMPLANT
SET INTRO CAPELLA COAXIAL (SET/KITS/TRAYS/PACK) ×2 IMPLANT
SHEATH BRITE TIP 6FRX5.5 (SHEATH) ×2 IMPLANT
SHIELD X-DRAPE GOLD 12X17 (MISCELLANEOUS) ×2 IMPLANT
SUT MNCRL AB 4-0 PS2 18 (SUTURE) ×2 IMPLANT
WIRE MAGIC TORQUE 260C (WIRE) ×2 IMPLANT

## 2016-07-31 NOTE — Op Note (Signed)
OPERATIVE NOTE   PROCEDURE: 1. Contrast injection left brachiocephalic  AV access 2. Percutaneous transluminal angioplasty to 8 mm left radiocephalic fistula just above the anastomosis  PRE-OPERATIVE DIAGNOSIS: Complication of dialysis access                                                       End Stage Renal Disease  POST-OPERATIVE DIAGNOSIS: same as above   SURGEON: Katha Cabal, M.D.  ANESTHESIA: Conscious sedation was administered under my direct supervision by the interventional radiology RN. IV Versed plus fentanyl were utilized. Continuous ECG, pulse oximetry and blood pressure was monitored throughout the entire procedure.  Conscious sedation was for a total of 37 minutes.  ESTIMATED BLOOD LOSS: minimal  FINDING(S): Stricture of the AV graft  SPECIMEN(S):  None  CONTRAST: 30 cc  FLUOROSCOPY TIME: 2.1 minutes  INDICATIONS: Renee Daugherty is a 83 y.o. female who  presents with malfunctioning left brachiocephalic AV access.  The patient is scheduled for angiography with possible intervention of the AV access.  The patient is aware the risks include but are not limited to: bleeding, infection, thrombosis of the cannulated access, and possible anaphylactic reaction to the contrast.  The patient acknowledges if the access can not be salvaged a tunneled catheter will be needed and will be placed during this procedure.  The patient is aware of the risks of the procedure and elects to proceed with the angiogram and intervention.  DESCRIPTION: After full informed written consent was obtained, the patient was brought back to the Special Procedure suite and placed supine position.  Appropriate cardiopulmonary monitors were placed.  The left arm was prepped and draped in the standard fashion.  Appropriate timeout is called. The left brachiocephalic fistula  was cannulated with a micropuncture needle in the retrograde direction.  Cannulation was performed with ultrasound guidance.  Ultrasound was placed in a sterile sleeve, the AV access was interrogated and noted to be echolucent and compressible indicating patency. Image was recorded for the permanent record. The puncture is performed under continuous ultrasound visualization.   The microwire was advanced and the needle was exchanged for  a microsheath.  The J-wire was then advanced and a 6 Fr sheath inserted.  A floppy Glidewire and Kumpe catheter within negotiated into the brachial artery.  Hand injections were completed to image the access from the arterial anastomosis through the entire access.  The central venous structures were also imaged by hand injections.  Based on the images,  3000 units of heparin was given and a Magic torque wire was exchanged through the Kumpe catheter.  Initially, a 4 x 6 Lutonix drug-eluting balloon was inflated across the anastomosis. Then a 6 x 4 Lutonix drug-eluting balloon was used just above the anastomosis.  The third inflation was using an 8 x 2 Armada balloon.  Inflations were to 12 atm for 1 minute.  Follow-up imaging demonstrates less than 15% residual stenosis of the stricture with rapid flow of contrast through the graft, the central venous anatomy is preserved.  A 4-0 Monocryl purse-string suture was sewn around the sheath.  The sheath was removed and light pressure was applied.  A sterile bandage was applied to the puncture site.    COMPLICATIONS: None  CONDITION: [name], M.D [facility] Office: [phone]  07/31/2016 11:36 AM

## 2016-07-31 NOTE — Discharge Instructions (Signed)
AV Fistula Placement  Follow up apt on Sept 10th with HDA   Refer to this sheet in the next few weeks. These instructions provide you with information about caring for yourself after your procedure. Your health care provider may also give you more specific instructions. Your treatment has been planned according to current medical practices, but problems sometimes occur. Call your health care provider if you have any problems or questions after your procedure. What can I expect after the procedure? After your procedure, it is common to:  Feel sore.  Have numbness.  Feel cold.  Feel a vibration (thrill) over the fistula.  Follow these instructions at home:  Incision care  Do not take baths or showers, swim, or use a hot tub until your health care provider approves.  Keep the area around your cut from surgery (incision) clean and dry.  Follow instructions from your health care provider about how to take care of your incision. Make sure you: ? Wash your hands with soap and water before you change your bandage (dressing). If soap and water are not available, use hand sanitizer. ? Change your dressing as told by your health care provider. ? Leave stitches (sutures) and clips in place. They may need to stay in place for 2 weeks or longer. Fistula Care  Check your fistula site every day to make sure the thrill feels the same.  Check your fistula site every day for signs of infection. Watch for: ? Redness. ? Swelling. ? Discharge. ? Tenderness. ? Enlargement.  Keep your arm raised (elevated) while you rest.  Do not lift anything that is heavier than a gallon of milk with the arm that has the fistula.  Do not lie down on your fistula arm.  Do not let anyone draw blood or take a blood pressure reading on your fistula arm.  Do not wear tight jewelry or clothing over your fistula arm. General instructions  Rest at home for a day or two.  Gradually start doing your usual  activities again. Ask your surgeon when you can return to work or school.  Take over-the-counter and prescription medicines only as told by your health care provider.  Keep all follow-up visits as told by your health care provider. This is important. Contact a health care provider if:  You have chills or a fever.  You have pain at your fistula site that is not going away.  You have numbness or coldness at your fistula site that is not going away.  You feel a decrease or a change in the thrill.  You have swelling in your arm or hand.  You have redness, swelling, discharge, tenderness, or enlargement at your fistula site. Get help right away if:  You are bleeding from your fistula site.  You have chest pain.  You have trouble breathing. This information is not intended to replace advice given to you by your health care provider. Make sure you discuss any questions you have with your health care provider. Document Released: 01/01/2005 Document Revised: 05/11/2015 Document Reviewed: 03/24/2014 Elsevier Interactive Patient Education  2018 [employer]

## 2016-07-31 NOTE — H&P (Signed)
[facility] History & Physical Update  The patient was interviewed and re-examined.  The patient's previous History and Physical has been reviewed and is unchanged.  There is no change in the plan of care. We plan to proceed with the scheduled procedure.  Levora DredgeGregory Zane Pellecchia, MD  07/31/2016, 10:37 AM

## 2016-08-01 ENCOUNTER — Encounter: Payer: Self-pay | Admitting: Vascular Surgery

## 2016-09-24 ENCOUNTER — Encounter (INDEPENDENT_AMBULATORY_CARE_PROVIDER_SITE_OTHER): Payer: Medicare Other

## 2016-09-24 ENCOUNTER — Ambulatory Visit (INDEPENDENT_AMBULATORY_CARE_PROVIDER_SITE_OTHER): Payer: Medicare Other | Admitting: Vascular Surgery

## 2016-10-23 ENCOUNTER — Emergency Department: Payer: Medicare Other

## 2016-10-23 ENCOUNTER — Inpatient Hospital Stay
Admission: EM | Admit: 2016-10-23 | Discharge: 2016-11-15 | DRG: 311 | Disposition: E | Payer: Medicare Other | Attending: Internal Medicine | Admitting: Internal Medicine

## 2016-10-23 ENCOUNTER — Encounter: Payer: Self-pay | Admitting: Emergency Medicine

## 2016-10-23 DIAGNOSIS — I200 Unstable angina: Principal | ICD-10-CM | POA: Diagnosis present

## 2016-10-23 DIAGNOSIS — I771 Stricture of artery: Secondary | ICD-10-CM

## 2016-10-23 DIAGNOSIS — Z833 Family history of diabetes mellitus: Secondary | ICD-10-CM

## 2016-10-23 DIAGNOSIS — N2581 Secondary hyperparathyroidism of renal origin: Secondary | ICD-10-CM | POA: Diagnosis present

## 2016-10-23 DIAGNOSIS — R1013 Epigastric pain: Secondary | ICD-10-CM

## 2016-10-23 DIAGNOSIS — Z992 Dependence on renal dialysis: Secondary | ICD-10-CM | POA: Diagnosis not present

## 2016-10-23 DIAGNOSIS — D631 Anemia in chronic kidney disease: Secondary | ICD-10-CM | POA: Diagnosis present

## 2016-10-23 DIAGNOSIS — F0280 Dementia in other diseases classified elsewhere without behavioral disturbance: Secondary | ICD-10-CM | POA: Diagnosis present

## 2016-10-23 DIAGNOSIS — E1151 Type 2 diabetes mellitus with diabetic peripheral angiopathy without gangrene: Secondary | ICD-10-CM | POA: Diagnosis present

## 2016-10-23 DIAGNOSIS — K219 Gastro-esophageal reflux disease without esophagitis: Secondary | ICD-10-CM | POA: Diagnosis present

## 2016-10-23 DIAGNOSIS — Z515 Encounter for palliative care: Secondary | ICD-10-CM | POA: Diagnosis present

## 2016-10-23 DIAGNOSIS — Z7951 Long term (current) use of inhaled steroids: Secondary | ICD-10-CM | POA: Diagnosis not present

## 2016-10-23 DIAGNOSIS — Z79899 Other long term (current) drug therapy: Secondary | ICD-10-CM

## 2016-10-23 DIAGNOSIS — R570 Cardiogenic shock: Secondary | ICD-10-CM | POA: Diagnosis present

## 2016-10-23 DIAGNOSIS — Z66 Do not resuscitate: Secondary | ICD-10-CM | POA: Diagnosis present

## 2016-10-23 DIAGNOSIS — N186 End stage renal disease: Secondary | ICD-10-CM | POA: Diagnosis present

## 2016-10-23 DIAGNOSIS — G20 Parkinson's disease: Secondary | ICD-10-CM | POA: Diagnosis present

## 2016-10-23 DIAGNOSIS — E1122 Type 2 diabetes mellitus with diabetic chronic kidney disease: Secondary | ICD-10-CM | POA: Diagnosis present

## 2016-10-23 DIAGNOSIS — Z8249 Family history of ischemic heart disease and other diseases of the circulatory system: Secondary | ICD-10-CM | POA: Diagnosis not present

## 2016-10-23 DIAGNOSIS — I120 Hypertensive chronic kidney disease with stage 5 chronic kidney disease or end stage renal disease: Secondary | ICD-10-CM | POA: Diagnosis present

## 2016-10-23 DIAGNOSIS — I774 Celiac artery compression syndrome: Secondary | ICD-10-CM | POA: Diagnosis present

## 2016-10-23 DIAGNOSIS — Z7984 Long term (current) use of oral hypoglycemic drugs: Secondary | ICD-10-CM | POA: Diagnosis not present

## 2016-10-23 DIAGNOSIS — R0789 Other chest pain: Secondary | ICD-10-CM | POA: Diagnosis not present

## 2016-10-23 DIAGNOSIS — R079 Chest pain, unspecified: Secondary | ICD-10-CM | POA: Diagnosis present

## 2016-10-23 HISTORY — DX: Disorder of kidney and ureter, unspecified: N28.9

## 2016-10-23 LAB — CBC WITH DIFFERENTIAL/PLATELET
Basophils Absolute: 0 10*3/uL (ref 0–0.1)
Basophils Relative: 1 %
EOS ABS: 0 10*3/uL (ref 0–0.7)
EOS PCT: 1 %
HCT: 35.3 % (ref 35.0–47.0)
Hemoglobin: 12 g/dL (ref 12.0–16.0)
LYMPHS ABS: 0.7 10*3/uL — AB (ref 1.0–3.6)
Lymphocytes Relative: 10 %
MCH: 32.9 pg (ref 26.0–34.0)
MCHC: 34 g/dL (ref 32.0–36.0)
MCV: 97 fL (ref 80.0–100.0)
MONO ABS: 0.6 10*3/uL (ref 0.2–0.9)
Monocytes Relative: 7 %
Neutro Abs: 6.2 10*3/uL (ref 1.4–6.5)
Neutrophils Relative %: 81 %
PLATELETS: 247 10*3/uL (ref 150–440)
RBC: 3.64 MIL/uL — AB (ref 3.80–5.20)
RDW: 15 % — AB (ref 11.5–14.5)
WBC: 7.6 10*3/uL (ref 3.6–11.0)

## 2016-10-23 LAB — PROTIME-INR
INR: 0.98
Prothrombin Time: 12.9 seconds (ref 11.4–15.2)

## 2016-10-23 LAB — COMPREHENSIVE METABOLIC PANEL
ALK PHOS: 198 U/L — AB (ref 38–126)
AST: 18 U/L (ref 15–41)
Albumin: 3.9 g/dL (ref 3.5–5.0)
Anion gap: 13 (ref 5–15)
BUN: 14 mg/dL (ref 6–20)
CALCIUM: 8.7 mg/dL — AB (ref 8.9–10.3)
CO2: 29 mmol/L (ref 22–32)
CREATININE: 3.53 mg/dL — AB (ref 0.44–1.00)
Chloride: 96 mmol/L — ABNORMAL LOW (ref 101–111)
GFR calc non Af Amer: 11 mL/min — ABNORMAL LOW (ref >60)
GFR, EST AFRICAN AMERICAN: 12 mL/min — AB (ref >60)
Glucose, Bld: 173 mg/dL — ABNORMAL HIGH (ref 65–99)
Potassium: 3.5 mmol/L (ref 3.5–5.1)
SODIUM: 138 mmol/L (ref 135–145)
Total Bilirubin: 0.5 mg/dL (ref 0.3–1.2)
Total Protein: 7.6 g/dL (ref 6.5–8.1)

## 2016-10-23 LAB — GLUCOSE, CAPILLARY
GLUCOSE-CAPILLARY: 140 mg/dL — AB (ref 65–99)
GLUCOSE-CAPILLARY: 155 mg/dL — AB (ref 65–99)
GLUCOSE-CAPILLARY: 131 mg/dL — AB (ref 65–99)

## 2016-10-23 LAB — TROPONIN I
Troponin I: 0.03 ng/mL (ref <0.03)
Troponin I: 0.03 ng/mL (ref <0.03)

## 2016-10-23 LAB — LIPASE, BLOOD: Lipase: 43 U/L (ref 11–51)

## 2016-10-23 MED ORDER — TRAZODONE HCL 50 MG PO TABS
100.0000 mg | ORAL_TABLET | Freq: Every day | ORAL | Status: DC
  Administered 2016-10-23 – 2016-10-25 (×3): 100 mg via ORAL
  Filled 2016-10-23: qty 2
  Filled 2016-10-23: qty 1
  Filled 2016-10-23: qty 2

## 2016-10-23 MED ORDER — GLIPIZIDE ER 10 MG PO TB24
10.0000 mg | ORAL_TABLET | Freq: Two times a day (BID) | ORAL | Status: DC
  Administered 2016-10-23: 10 mg via ORAL
  Filled 2016-10-23 (×2): qty 1

## 2016-10-23 MED ORDER — PIOGLITAZONE HCL 15 MG PO TABS
45.0000 mg | ORAL_TABLET | Freq: Every day | ORAL | Status: DC
  Administered 2016-10-23 – 2016-10-24 (×2): 45 mg via ORAL
  Filled 2016-10-23 (×3): qty 3

## 2016-10-23 MED ORDER — ASPIRIN EC 81 MG PO TBEC
81.0000 mg | DELAYED_RELEASE_TABLET | Freq: Every day | ORAL | Status: DC
  Administered 2016-10-24 – 2016-10-26 (×3): 81 mg via ORAL
  Filled 2016-10-23 (×3): qty 1

## 2016-10-23 MED ORDER — ONDANSETRON HCL 4 MG/2ML IJ SOLN: 4.0000 mg | Freq: Four times a day (QID) | INTRAMUSCULAR | Status: DC | PRN

## 2016-10-23 MED ORDER — DONEPEZIL HCL 5 MG PO TABS
10.0000 mg | ORAL_TABLET | Freq: Every day | ORAL | Status: DC
  Administered 2016-10-23 – 2016-10-25 (×3): 10 mg via ORAL
  Filled 2016-10-23 (×4): qty 2

## 2016-10-23 MED ORDER — ACETAMINOPHEN 650 MG RE SUPP: 650.0000 mg | Freq: Four times a day (QID) | RECTAL | Status: DC | PRN

## 2016-10-23 MED ORDER — CARBIDOPA-LEVODOPA ER 50-200 MG PO TBCR
1.0000 | EXTENDED_RELEASE_TABLET | Freq: Three times a day (TID) | ORAL | Status: DC
  Administered 2016-10-23 – 2016-10-26 (×7): 1 via ORAL
  Filled 2016-10-23 (×11): qty 1

## 2016-10-23 MED ORDER — ACETAMINOPHEN 325 MG PO TABS
650.0000 mg | ORAL_TABLET | Freq: Four times a day (QID) | ORAL | Status: DC | PRN
  Administered 2016-10-24: 650 mg via ORAL
  Filled 2016-10-23: qty 2

## 2016-10-23 MED ORDER — PREGABALIN 75 MG PO CAPS
150.0000 mg | ORAL_CAPSULE | Freq: Every day | ORAL | Status: DC
  Administered 2016-10-25 – 2016-10-26 (×2): 150 mg via ORAL
  Filled 2016-10-23 (×3): qty 2

## 2016-10-23 MED ORDER — CINACALCET HCL 30 MG PO TABS
30.0000 mg | ORAL_TABLET | ORAL | Status: DC
  Administered 2016-10-23 – 2016-10-25 (×2): 30 mg via ORAL
  Filled 2016-10-23 (×2): qty 1

## 2016-10-23 MED ORDER — POLYETHYLENE GLYCOL 3350 17 G PO PACK: 17.0000 g | PACK | Freq: Every day | ORAL | Status: DC | PRN

## 2016-10-23 MED ORDER — ATORVASTATIN CALCIUM 20 MG PO TABS
40.0000 mg | ORAL_TABLET | Freq: Every day | ORAL | Status: DC
  Administered 2016-10-23 – 2016-10-25 (×3): 40 mg via ORAL
  Filled 2016-10-23 (×3): qty 2

## 2016-10-23 MED ORDER — FENTANYL CITRATE (PF) 100 MCG/2ML IJ SOLN
50.0000 ug | Freq: Once | INTRAMUSCULAR | Status: AC
  Administered 2016-10-23: 50 ug via INTRAVENOUS

## 2016-10-23 MED ORDER — MORPHINE SULFATE (PF) 4 MG/ML IV SOLN
4.0000 mg | Freq: Once | INTRAVENOUS | Status: AC
  Administered 2016-10-23: 4 mg via INTRAVENOUS
  Filled 2016-10-23: qty 1

## 2016-10-23 MED ORDER — MOMETASONE FURO-FORMOTEROL FUM 200-5 MCG/ACT IN AERO
2.0000 | INHALATION_SPRAY | Freq: Two times a day (BID) | RESPIRATORY_TRACT | Status: DC
Start: 2016-10-23 — End: 2016-10-26
  Administered 2016-10-23 – 2016-10-25 (×4): 2 via RESPIRATORY_TRACT
  Filled 2016-10-23: qty 8.8

## 2016-10-23 MED ORDER — PANTOPRAZOLE SODIUM 40 MG PO TBEC
40.0000 mg | DELAYED_RELEASE_TABLET | Freq: Every day | ORAL | Status: DC
  Administered 2016-10-24 – 2016-10-26 (×3): 40 mg via ORAL
  Filled 2016-10-23 (×3): qty 1

## 2016-10-23 MED ORDER — METOPROLOL TARTRATE 25 MG PO TABS
25.0000 mg | ORAL_TABLET | Freq: Two times a day (BID) | ORAL | Status: DC
  Filled 2016-10-23: qty 1

## 2016-10-23 MED ORDER — FENTANYL CITRATE (PF) 100 MCG/2ML IJ SOLN
INTRAMUSCULAR | Status: AC
  Administered 2016-10-23: 50 ug via INTRAVENOUS
  Filled 2016-10-23: qty 2

## 2016-10-23 MED ORDER — CALCIUM ACETATE (PHOS BINDER) 667 MG PO CAPS
1334.0000 mg | ORAL_CAPSULE | Freq: Three times a day (TID) | ORAL | Status: DC
  Administered 2016-10-23 – 2016-10-26 (×6): 1334 mg via ORAL
  Filled 2016-10-23 (×10): qty 2

## 2016-10-23 MED ORDER — ALBUTEROL SULFATE (2.5 MG/3ML) 0.083% IN NEBU: 2.5000 mg | INHALATION_SOLUTION | Freq: Four times a day (QID) | RESPIRATORY_TRACT | Status: DC | PRN

## 2016-10-23 MED ORDER — GABAPENTIN 100 MG PO CAPS
200.0000 mg | ORAL_CAPSULE | Freq: Every day | ORAL | Status: DC
  Administered 2016-10-23 – 2016-10-25 (×3): 200 mg via ORAL
  Filled 2016-10-23 (×3): qty 2

## 2016-10-23 MED ORDER — MEMANTINE HCL 5 MG PO TABS
10.0000 mg | ORAL_TABLET | Freq: Two times a day (BID) | ORAL | Status: DC
  Administered 2016-10-23 – 2016-10-26 (×5): 10 mg via ORAL
  Filled 2016-10-23 (×5): qty 2

## 2016-10-23 MED ORDER — HEPARIN SODIUM (PORCINE) 5000 UNIT/ML IJ SOLN
5000.0000 [IU] | Freq: Three times a day (TID) | INTRAMUSCULAR | Status: DC
  Administered 2016-10-23 – 2016-10-24 (×2): 5000 [IU] via SUBCUTANEOUS
  Filled 2016-10-23 (×2): qty 1

## 2016-10-23 MED ORDER — FLUTICASONE PROPIONATE 50 MCG/ACT NA SUSP
2.0000 | Freq: Every day | NASAL | Status: DC
  Administered 2016-10-23 – 2016-10-24 (×2): 2 via NASAL
  Filled 2016-10-23: qty 16

## 2016-10-23 MED ORDER — GI COCKTAIL ~~LOC~~
30.0000 mL | Freq: Once | ORAL | Status: AC
  Administered 2016-10-23: 30 mL via ORAL
  Filled 2016-10-23: qty 30

## 2016-10-23 MED ORDER — ONDANSETRON HCL 4 MG PO TABS: 4.0000 mg | ORAL_TABLET | Freq: Four times a day (QID) | ORAL | Status: DC | PRN

## 2016-10-23 MED ORDER — PAROXETINE HCL 10 MG PO TABS
10.0000 mg | ORAL_TABLET | Freq: Every day | ORAL | Status: DC
  Administered 2016-10-24 – 2016-10-26 (×3): 10 mg via ORAL
  Filled 2016-10-23: qty 1
  Filled 2016-10-23: qty 0.5
  Filled 2016-10-23: qty 1

## 2016-10-23 MED ORDER — IOPAMIDOL (ISOVUE-370) INJECTION 76%
100.0000 mL | Freq: Once | INTRAVENOUS | Status: AC | PRN
  Administered 2016-10-23: 100 mL via INTRAVENOUS

## 2016-10-23 NOTE — ED Notes (Signed)
Called dialysis to have them deaccess dialysis access.

## 2016-10-23 NOTE — Progress Notes (Signed)
Responded to a page for [employer] for a pt. in Rm09. Nurse and doctors evaluating pt. when [facility] arrived. Pt was alert, talking but complained of being in pain. Family arrived shortly and were at bedside. CH provided pastoral care and presence. CH available to follow up as needed.    11/10/2016 1700  Clinical Encounter Type  Visited With Patient and family together  Visit Type Initial;Follow-up;Spiritual support;Code;ED  Referral From Nurse  Consult/Referral To Chaplain  Spiritual Encounters  Spiritual Needs Other (Comment)

## 2016-10-23 NOTE — ED Provider Notes (Addendum)
[facility] Emergency Department Provider Note  ____________________________________________   I have reviewed the triage vital signs and the nursing notes.   HISTORY  Chief Complaint Code STEMI    HPI Renee Daugherty is a 83 y.o. female  With hx of ESRD dementia htn who was at dialysis today and began having abdominal/chest pain. The pain is very poorly described by the patient. Denies any fever or chills. States her abdomen is always "sore".  EMS called in a code STEMI because of EKG changes. Patient does have a known right bundle-branch block. There was ST changes that were consistent with possible acute MI. Cardiology therefore was present at bedside. As were family. After much discussion, family elected to keep the patient DNR/DNI, they do not want any significant interventions but they do on diagnostic and comfort care administered. Patient states the pain is mostly "here" and indicates her epigastric region going up towards her chest. She cannot tell me how often she has it it is not exertional she almost finished her dialysis.    Past Medical History:  Diagnosis Date  . Arthritis   . Chronic kidney disease   . Depression   . Diabetes mellitus without complication (HCC)   . Dyspnea   . GERD (gastroesophageal reflux disease)   . Lymphedema   . Parkinson disease (HCC)   . Peripheral vascular disease [facility])     Patient Active Problem List   Diagnosis Date Noted  . Parkinson's disease (HCC) 07/23/2016  . End stage renal disease (HCC) 11/21/2015  . Complication of vascular access for dialysis 11/21/2015  . Pain in limb 11/21/2015  . Essential hypertension 11/21/2015    Past Surgical History:  Procedure Laterality Date  . A/V FISTULAGRAM Left 06/26/2016   Procedure: A/V Fistulagram;  Surgeon: Renford Dills, MD;  Location: [facility] INVASIVE CV LAB;  Service: Cardiovascular;  Laterality: Left;  . A/V FISTULAGRAM Left 07/31/2016   Procedure: A/V  Fistulagram;  Surgeon: Renford Dills, MD;  Location: [facility] INVASIVE CV LAB;  Service: Cardiovascular;  Laterality: Left;  . CHOLECYSTECTOMY    . DIALYSIS FISTULA CREATION    . PERIPHERAL VASCULAR CATHETERIZATION Left 12/06/2014   Procedure: A/V Shuntogram/Fistulagram;  Surgeon: Annice Needy, MD;  Location: [facility] INVASIVE CV LAB;  Service: Cardiovascular;  Laterality: Left;  . PERIPHERAL VASCULAR CATHETERIZATION N/A 12/06/2014   Procedure: A/V Shunt Intervention;  Surgeon: Annice Needy, MD;  Location: [facility] INVASIVE CV LAB;  Service: Cardiovascular;  Laterality: N/A;  . PERIPHERAL VASCULAR CATHETERIZATION Left 11/23/2015   Procedure: A/V Shuntogram/Fistulagram;  Surgeon: Renford Dills, MD;  Location: [facility] INVASIVE CV LAB;  Service: Cardiovascular;  Laterality: Left;    Prior to Admission medications   Medication Sig Start Date End Date Taking? Authorizing Provider  acetaminophen (TYLENOL) 650 MG CR tablet Take 1,300 mg by mouth 2 (two) times daily.   Yes [provider]  albuterol (PROVENTIL) (2.5 MG/3ML) 0.083% nebulizer solution Take 2.5 mg by nebulization every 6 (six) hours as needed for wheezing or shortness of breath.   Yes [provider]  budesonide-formoterol (SYMBICORT) 160-4.5 MCG/ACT inhaler Inhale 2 puffs into the lungs 2 (two) times daily.   Yes [provider]  calcium acetate (PHOSLO) 667 MG capsule Take 1,334 mg by mouth 3 (three) times daily with meals. And takes 667 mg with snacks   Yes [provider]  carbidopa-levodopa (SINEMET CR) 50-200 MG tablet Take 1 tablet by mouth 3 (three) times daily.  07/14/16  Yes  [provider]  cinacalcet (SENSIPAR) 30 MG tablet Take 30 mg by mouth every Tuesday, Thursday, and Saturday at 6 PM.    Yes [provider]  donepezil (ARICEPT) 10 MG tablet Take 10 mg by mouth at bedtime.  07/31/15  Yes [provider]  esomeprazole (NEXIUM) 40 MG capsule Take 40 mg by mouth daily.    Yes  [provider]  fluticasone (FLONASE) 50 MCG/ACT nasal spray Place 2 sprays into both nostrils daily.   Yes [provider]  gabapentin (NEURONTIN) 100 MG capsule Take 200 mg by mouth at bedtime.    Yes [provider]  glipiZIDE (GLUCOTROL XL) 10 MG 24 hr tablet Take 10 mg by mouth 2 (two) times daily.   Yes [provider]  memantine (NAMENDA) 10 MG tablet Take 10 mg by mouth 2 (two) times daily.    Yes [provider]  PARoxetine (PAXIL) 10 MG tablet Take 10 mg by mouth daily.   Yes [provider]  pioglitazone (ACTOS) 45 MG tablet Take 45 mg by mouth at bedtime.   Yes [provider]  pregabalin (LYRICA) 150 MG capsule Take 150 mg by mouth daily.    Yes [provider]  traZODone (DESYREL) 50 MG tablet Take 100 mg by mouth at bedtime.    Yes [provider]    Allergies Metformin; Midodrine; Tape; and Levaquin [levofloxacin in d5w]  Family History  Problem Relation Age of Onset  . Diabetes Father   . Cancer Sister   . Heart failure Sister   . Brain cancer Brother     Social History Social History  Substance Use Topics  . Smoking status: Never Smoker  . Smokeless tobacco: Never Used  . Alcohol use No    Review of Systems Constitutional: No fever/chills Eyes: No visual changes. ENT: No sore throat. No stiff neck no neck pain Cardiovascular: positivechest pain. Respiratory: Denies shortness of breath. Gastrointestinal:   no vomiting.  No diarrhea.  No constipation. Genitourinary: Negative for dysuria. Musculoskeletal: Negative lower extremity swelling Skin: Negative for rash. Neurological: Negative for severe headaches, focal weakness or numbness.   ____________________________________________   PHYSICAL EXAM:  VITAL SIGNS: ED Triage Vitals  Enc Vitals Group     BP 11-12-16 1132 130/75     Pulse Rate 2016-11-12 1132 72     Resp Nov 12, 2016 1132 (!) 22     Temp Nov 12, 2016 1132 98.2 F  (36.8 C)     Temp Source 2016-11-12 1132 Oral     SpO2 11-12-2016 1132 100 %     Weight 2016/11/12 1134 205 lb 4 oz (93.1 kg)     Height 12-Nov-2016 1134  (1.626 m)     Head Circumference --      Peak Flow --      Pain Score --      Pain Loc --      Pain Edu? --      Excl. in GC? --     Constitutional: Alert and orientedto name and place unsure of the date, anxious and upset suffers from dementia, nontoxic otherwise however Eyes: Conjunctivae are normal Head: Atraumatic HEENT: No congestion/rhinnorhea. Mucous membranes are moist.  Oropharynx non-erythematous Neck:   Nontender with no meningismus, no masses, no stridor Cardiovascular: Normal rate, regular rhythm. Grossly normal heart sounds.  Good peripheral circulation. Respiratory: Normal respiratory effort.  No retractions. Lungs CTAB. Abdominal: Soft and positive epigastric tenderness which reproduces her pain. No distention. on Terryguarding no rebound  Back:  There is no focal tenderness or step off.  there is no midline tenderness there are no lesions noted. there is no CVA tenderness  Musculoskeletal: No lower extremity tenderness, no upper extremity tenderness. No joint effusions, no DVT signs strong distal pulses no edema Neurologic:  Normal speech and language. No gross focal neurologic deficits are appreciated.  Skin:  Skin is warm, dry and intact. No rash noted. Psychiatric: Mood and affect are normal. Speech and behavior are normal.  ____________________________________________   LABS (all labs ordered are listed, but only abnormal results are displayed)  Labs Reviewed  CBC WITH DIFFERENTIAL/PLATELET - Abnormal; Notable for the following:       Result Value   RBC 3.64 (*)    RDW 15.0 (*)    Lymphs Abs 0.7 (*)    All other components within normal limits  GLUCOSE, CAPILLARY - Abnormal; Notable for the following:    Glucose-Capillary 140 (*)    All other components within normal limits  COMPREHENSIVE METABOLIC PANEL   LIPASE, BLOOD  TROPONIN I  PROTIME-INR    Pertinent labs  results that were available during my care of the patient were reviewed by me and considered in my medical decision making (see chart for details). ____________________________________________  EKG  I personally interpreted any EKGs ordered by me or triage right bundle-branch block, subtle ST elevation noted in lateral leads as well as inferior, ____________________________________________  RADIOLOGY  Pertinent labs & imaging results that were available during my care of the patient were reviewed by me and considered in my medical decision making (see chart for details). If possible, patient and/or family made aware of any abnormal findings. ____________________________________________    PROCEDURES  Procedure(s) performed: None  Procedures  Critical Care performed: None  ____________________________________________   INITIAL IMPRESSION / ASSESSMENT AND PLAN / ED COURSE  Pertinent labs & imaging results that were available during my care of the patient were reviewed by me and considered in my medical decision making (see chart for details).  as noted above, patient here with chest/abdominal pain that began shortly before arrival. Nothing made it better nothing made it worse. Unclear etiology. She did have some subtle changes on EKG. Doctor End of cardiology did extensively discussed with the family, they do not wish the patient to have a emergent heart catheter, in addition, there is some confusion about whether this is actually coming from her heart despite her EKG or whether this is abdominal or some comminution to such as aortic, we'll obtain a CT angiogram to evaluate that further we have given her pain medication and she is feeling somewhat better, vital signs are reassuring, blood work is pending, patient is DNR/DNI after discussion with cardiology  ----------------------------------------- 12:12 PM on  10/20/2016 -----------------------------------------  D/w dr. Juliann Pares at the request of dr. Okey Dupre as dr. Gwen Pounds is her cardiologist. He agrees w/ mgt. Pt comfortable after pain meds states her pain I s nearly gone.  Back from ct.  Clinical Course as of Oct 23 1320  Tue Oct 23, 2016  1307 ----------------------------------------- 1:07 PM on @ -----------------------------------------  patient much workup will however does have ongoing to palpation of the epigastric abdominal region nothing on CT aside from  some degree of abdominal aarterial stenosis. I will discuss with vascular surgery whether this is easily amenable to intervention. Family are considered they think it would help her suffering. Patient not requiring more pain medicationbut still has some discomfort. He is still on palpation. This is reproductive  of the pain I think this is more likely the etiology cardiac event although also is reassuring her initial troponin is elevated however it is very early in the course of this and we will send another  [JM]    Clinical Course User Index [JM] Jeanmarie Plant, MD   _____discussed with Dr. dew, who agrees that this is likely the cause of her pain does not feel emergent intervention is indicated at this time even family consents to it. Family is concerned the patient has ongoing pain which she has had a recurrence of her discomfort. Vital signs are reassuring, repeat EKG doesn't show any acute changes from what it was already, we are giving her pain medication and I will admit_______________________________________   FINAL CLINICAL IMPRESSION(S) / ED DIAGNOSES  Final diagnoses:  None      This chart was dictated using voice recognition software.  Despite best efforts to proofread,  errors can occur which can change meaning.      Jeanmarie Plant, MD 2016-11-02 1156    Jeanmarie Plant, MD 11/02/16 1213    Jeanmarie Plant, MD 2016-11-02 1323

## 2016-10-23 NOTE — ED Notes (Signed)
Pt transported to CT, pt on zoll monitor, accompanied by this RN.

## 2016-10-23 NOTE — H&P (Signed)
[facility] - [place] at [facility]   PATIENT NAME: Renee Daugherty    MR#:  [id number]  DATE OF BIRTH:  23-Nov-1926  DATE OF ADMISSION:  11/13/2016  PRIMARY CARE PHYSICIAN: Jerl Mina, MD   REQUESTING/REFERRING PHYSICIAN: Dr. Alphonzo Lemmings  CHIEF COMPLAINT:  hest pain and abdominal pain on dialysis today  HISTORY OF PRESENT ILLNESS:  Renee Daugherty  is a 83 y.o. female with a known history of end-stage renal disease on hemodialysis, hypertension, diabetes, Parkinson's, dementia comes to the emergency room from hemodialysis after she started experiencing chest pressure and abdominal pain today. Patient states his chest pressure followed up going to the neck. She denied any shortness of breath. She was brought to the emergency room and code STEMI was initiated. Patient was seen by cardiology dr end and after discussing with family emergent catheterization was held off. Medical management was decided and hospitalist was called for further nausea management Patient received morphine and pain is easing off. 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 Arthritis   . Chronic kidney disease   . Depression   . Diabetes mellitus without complication (HCC)   . Dyspnea   . GERD (gastroesophageal reflux disease)   . Lymphedema   . Parkinson disease (HCC)   . Peripheral vascular disease (HCC)   . Renal insufficiency     PAST SURGICAL HISTOIRY:   Past Surgical History:  Procedure Laterality Date  . A/V FISTULAGRAM Left 06/26/2016   Procedure: A/V Fistulagram;  Surgeon: Renford Dills, MD;  Location: [facility] INVASIVE CV LAB;  Service: Cardiovascular;  Laterality: Left;  . A/V FISTULAGRAM Left 07/31/2016   Procedure: A/V Fistulagram;  Surgeon: Renford Dills, MD;  Location: [facility] INVASIVE CV LAB;  Service: Cardiovascular;  Laterality:  Left;  . CHOLECYSTECTOMY    . DIALYSIS FISTULA CREATION    . PERIPHERAL VASCULAR CATHETERIZATION Left 12/06/2014   Procedure: A/V Shuntogram/Fistulagram;  Surgeon: Annice Needy, MD;  Location: [facility] INVASIVE CV LAB;  Service: Cardiovascular;  Laterality: Left;  . PERIPHERAL VASCULAR CATHETERIZATION N/A 12/06/2014   Procedure: A/V Shunt Intervention;  Surgeon: Annice Needy, MD;  Location: [facility] INVASIVE CV LAB;  Service: Cardiovascular;  Laterality: N/A;  . PERIPHERAL VASCULAR CATHETERIZATION Left 11/23/2015   Procedure: A/V Shuntogram/Fistulagram;  Surgeon: Renford Dills, MD;  Location: [facility] INVASIVE CV LAB;  Service: Cardiovascular;  Laterality: Left;    SOCIAL HISTORY:   Social History  Substance Use Topics  . Smoking status: Never Smoker  . Smokeless tobacco: Never Used  . Alcohol use No    FAMILY HISTORY:   Family History  Problem Relation Age of Onset  . Diabetes Father   . Cancer Sister   . Heart failure Sister   . Brain cancer Brother     DRUG ALLERGIES:   Allergies  Allergen Reactions  . Metformin     GI and renal problems  . Midodrine     Insomnia   . Tape Other (See Comments)    Rash, PT uses paper tape  . Levaquin [Levofloxacin In D5w] Rash    REVIEW OF SYSTEMS:  Review of Systems  Constitutional: Negative for chills, fever and weight loss.  HENT: Negative for ear discharge, ear pain and nosebleeds.   Eyes: Negative for blurred vision, pain and discharge.  Respiratory: Negative for sputum production, shortness of breath,  wheezing and stridor.   Cardiovascular: Positive for chest pain. Negative for palpitations, orthopnea and PND.  Gastrointestinal: Positive for abdominal pain. Negative for diarrhea, nausea and vomiting.  Genitourinary: Negative for frequency and urgency.  Musculoskeletal: Negative for back pain and joint pain.  Neurological: Positive for weakness. Negative for sensory change, speech change and focal weakness.  Psychiatric/Behavioral:  Negative for depression and hallucinations. The patient is not nervous/anxious.      MEDICATIONS AT HOME:   Prior to Admission medications   Medication Sig Start Date End Date Taking? Authorizing Provider  acetaminophen (TYLENOL) 650 MG CR tablet Take 1,300 mg by mouth 2 (two) times daily.   Yes [provider]  albuterol (PROVENTIL) (2.5 MG/3ML) 0.083% nebulizer solution Take 2.5 mg by nebulization every 6 (six) hours as needed for wheezing or shortness of breath.   Yes [provider]  budesonide-formoterol (SYMBICORT) 160-4.5 MCG/ACT inhaler Inhale 2 puffs into the lungs 2 (two) times daily.   Yes [provider]  calcium acetate (PHOSLO) 667 MG capsule Take 1,334 mg by mouth 3 (three) times daily with meals. And takes 667 mg with snacks   Yes [provider]  carbidopa-levodopa (SINEMET CR) 50-200 MG tablet Take 1 tablet by mouth 3 (three) times daily.  07/14/16  Yes [provider]  cinacalcet (SENSIPAR) 30 MG tablet Take 30 mg by mouth every Tuesday, Thursday, and Saturday at 6 PM.    Yes [provider]  donepezil (ARICEPT) 10 MG tablet Take 10 mg by mouth at bedtime.  07/31/15  Yes [provider]  esomeprazole (NEXIUM) 40 MG capsule Take 40 mg by mouth daily.    Yes [provider]  fluticasone (FLONASE) 50 MCG/ACT nasal spray Place 2 sprays into both nostrils daily.   Yes [provider]  gabapentin (NEURONTIN) 100 MG capsule Take 200 mg by mouth at bedtime.    Yes [provider]  glipiZIDE (GLUCOTROL XL) 10 MG 24 hr tablet Take 10 mg by mouth 2 (two) times daily.   Yes [provider]  memantine (NAMENDA) 10 MG tablet Take 10 mg by mouth 2 (two) times daily.    Yes [provider]  PARoxetine (PAXIL) 10 MG tablet Take 10 mg by mouth daily.   Yes [provider]  pioglitazone (ACTOS) 45 MG tablet Take 45 mg by mouth at bedtime.   Yes [provider]  pregabalin  (LYRICA) 150 MG capsule Take 150 mg by mouth daily.    Yes [provider]  traZODone (DESYREL) 50 MG tablet Take 100 mg by mouth at bedtime.    Yes [provider]      VITAL SIGNS:  Blood pressure (!) 141/88, pulse 78, temperature 98.2 F (36.8 C), temperature source Oral, resp. rate 19, height  (1.626 m), weight 93.1 kg (205 lb 4 oz), SpO2 100 %.  PHYSICAL EXAMINATION:  GENERAL:  83 y.o.-year-old patient lying in the bed with no acute distress.  EYES: Pupils equal, round, reactive to light and accommodation. No scleral icterus. Extraocular muscles intact.  HEENT: Head atraumatic, normocephalic. Oropharynx and nasopharynx clear.  NECK:  Supple, no jugular venous distention. No thyroid enlargement, no tenderness.  LUNGS: Normal breath sounds bilaterally, no wheezing, rales,rhonchi or crepitation. No use of accessory muscles of respiration.  CARDIOVASCULAR: S1, S2 normal. No murmurs, rubs, or gallops.  ABDOMEN: Soft, nontender, nondistended. Bowel sounds present. No organomegaly or mass.  EXTREMITIES: ++ pedal edema, no cyanosis, or clubbing.  NEUROLOGIC: Cranial nerves  II through XII are intact. Muscle strength 5/5 in all extremities. Sensation intact. Gait not checked.  PSYCHIATRIC: The patient is alert and oriented x 3.  SKIN: No obvious rash, lesion, or ulcer.   LABORATORY PANEL:   CBC  Recent Labs Lab 10/29/2016 1137  WBC 7.6  HGB 12.0  HCT 35.3  PLT 247   ------------------------------------------------------------------------------------------------------------------  Chemistries   Recent Labs Lab 11/05/2016 1137  NA 138  K 3.5  CL 96*  CO2 29  GLUCOSE 173*  BUN 14  CREATININE 3.53*  CALCIUM 8.7*  AST 18  ALT <5*  ALKPHOS 198*  BILITOT 0.5   ------------------------------------------------------------------------------------------------------------------  Cardiac Enzymes  Recent Labs Lab 10/30/2016 1137  TROPONINI <0.03    ------------------------------------------------------------------------------------------------------------------  RADIOLOGY:  Ct Angio Chest/abd/pel For Dissection W And/or Wo Contrast  Result Date: 11/06/2016 CLINICAL DATA:  Acute onset chest and abdominal pain this morning while having dialysis. EXAM: CT ANGIOGRAPHY CHEST, ABDOMEN AND PELVIS TECHNIQUE: Multidetector CT imaging through the chest, abdomen and pelvis was performed using the standard protocol during bolus administration of intravenous contrast. Multiplanar reconstructed images and MIPs were obtained and reviewed to evaluate the vascular anatomy. CONTRAST:  100 cc Isovue 370. COMPARISON:  CT chest 12/31/2014. CT chest, abdomen and pelvis 11/14/2011. FINDINGS: CTA CHEST FINDINGS Cardiovascular: There is no aortic dissection or aneurysm. Calcific aortic and coronary atherosclerosis is noted. There is mild cardiomegaly. No pericardial effusion. Vascular stent in the left subclavian vein is noted. Mediastinum/Nodes: Coarse calcification and heterogeneous appearance of the left lobe of the thyroid is chronic. No hiatal hernia. No lymphadenopathy. Lungs/Pleura: No pleural effusion. The lungs demonstrate mild dependent atelectasis. Musculoskeletal: No fracture or focal bony abnormality. Mildly increased density most consistent with renal osteodystrophy noted. Review of the MIP images confirms the above findings. CTA ABDOMEN AND PELVIS FINDINGS VASCULAR Aorta: No aortic dissection or aneurysm. Extensive atherosclerosis noted. Celiac: High-grade stenosis, 80-90%, is seen just distal to the takeoff. The vessel is well opacified with contrast distal to the stenosis. SMA: Atherosclerotic calcification at the takeoff without stenosis noted. Renals: Patent. IMA: Widely patent. Veins: Unremarkable. Review of the MIP images confirms the above findings. NON-VASCULAR Hepatobiliary: Status post cholecystectomy. No focal liver lesion. Pneumobilia is consistent  with prior sphincterotomy is chronic and unchanged since the prior chest CT. Pancreas: Unremarkable. No pancreatic ductal dilatation or surrounding inflammatory changes. Spleen: Normal in size without focal abnormality. Adrenals/Urinary Tract: Markedly atrophic kidneys consistent with chronic renal failure noted. The kidneys are otherwise normal. The urinary bladder is completely decompressed but otherwise unremarkable. Stomach/Bowel: Diverticulosis without diverticulitis is most notable in the sigmoid. The colon is otherwise unremarkable. The stomach and small bowel appear normal. There is no evidence of appendicitis. Lymphatic: As described above.  Otherwise unremarkable. Reproductive: Uterus and bilateral adnexa are unremarkable. Other: Tiny fat containing supraumbilical hernia is noted. There is some laxity of the anterior abdominal wall. No ascites. Musculoskeletal: Bones demonstrate mildly increased density throughout most consistent with renal osteodystrophy. There is convex left lumbar scoliosis and spondylosis. Review of the MIP images confirms the above findings. IMPRESSION: No acute abnormality chest, abdomen or pelvis. Negative for aortic dissection or aneurysm. Short segment high-grade stenosis just distal to the takeoff of the celiac axis. Cardiomegaly. Diverticulosis without diverticulitis. Electronically Signed   By: Drusilla Kanner M.D.   On: 10/16/2016 12:26    EKG:   Sinus rhythm, right bundle branch, Q waves in inferior leads IMPRESSION AND PLAN:    Renee Daugherty  is a 83 y.o. female  with a known history of end-stage renal disease on hemodialysis, hypertension, diabetes, Parkinson's, dementia comes to the emergency room from hemodialysis after she started experiencing chest pressure and abdominal pain today  1. Chest pain rule out MI -Admit to telemetry cycle cardiac enzymes 3, aspirin, statins, beta blockers -Cardiology consultation with Dr. Juliann Pares -Further workup for the  patient's clinical symptoms  2. Abdominal pain with CT abdomen evident of high-grade stenosis iliac artery -ER physician spoke with Dr. Wyn Quaker. We'll consult vascular -Continue aspirin and ?plavix  3. End-stage renal disease on hemodialysis -Nephrology consultation for in-house dialysis  4. Type 2 diabetes -Sliding scale and oral meds  5. History of Parkinson's and dementiapatient follows with Dr. Malvin Johns neurology as outpatient -Resume home meds  Care management and physical therapy  Above was discussed with patient's daughter. Patient is a DO NOT RESUSCITATE. Family does not wish any heroic or aggressive intervention they would like to keep patient comfortable and continue medical management  All the records are reviewed and case discussed with ED provider. Management plans discussed with the patient, family and they are in agreement.  CODE STATUS: DNR (d/w dter)  TOTAL TIME TAKING CARE OF THIS PATIENT: *50* minutes.    Renee Daugherty M.D on 11-16-2016 at 2:16 PM  Between 7am to 6pm - Pager - [phone]  After 6pm go to [email or web address] - password [email or web address] [email or web address]  [facility]  Office  [phone]  CC: Primary care physician; Jerl Mina, MD

## 2016-10-23 NOTE — ED Triage Notes (Addendum)
Patient from dialysis complaining of chest pain and abdominal pain. Patient was 27 minutes from finishing treatment. EMS EKG showed ST elevation in several leads. Patient with history of dementia and oriented at baseline per family. Patient given 324 mg ASA by EMS prior to arrival.

## 2016-10-23 NOTE — Significant Event (Signed)
STEMI Evaluation Note  Date: 2016/10/24 Time: 12:02 PM  STEMI alert was sent out by EMS for his summers. She developed acute onset of chest pain while at hemodialysis this morning. EMS was summoned and found lateral ST segment elevation in the setting of right bundle branch block. Upon arrival in the [facility] ED, the patient continues to complain of chest pain, back pain, and abdominal pain. EKG confirms sinus rhythm with right bundle branch block and 1 mm lateral ST elevations. I spoke with the patient, who has underlying dementia, as well as her son and daughter. In the setting of her multiple comorbidities and concern for possible aortic disease, we have agreed to defer emergent cardiac catheterization. His summers family states that she would not wish to undergo any heroic measures, including intubation and CPR. We have therefore agreed to manage her suspected MI medically. CTA of the chest and abdomen has been ordered by the ED today. The patient follows closely with Dr. Gwen Pounds. I will defer further management of the patient's MI to [facility].  Yvonne Kendall, MD [facility] Pager: [phone]

## 2016-10-23 NOTE — Progress Notes (Signed)
Family Meeting Note  Advance Directive: No  Today a meeting took place with the  Pt and dter in ER.  The following were discussed:Patient's diagnosis: , Patient's progosis:pt is being admitted for CP. 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 She has lot of medical issues. Family does not want and aggressive intervention be done. Medical management is fine with pt and family. Pt is DNR per daughter.   Time spent during discussion: 16 mins  Arjan Strohm, MD

## 2016-10-24 LAB — TROPONIN I: Troponin I: 0.04 ng/mL (ref <0.03)

## 2016-10-24 LAB — GLUCOSE, CAPILLARY
GLUCOSE-CAPILLARY: 338 mg/dL — AB (ref 65–99)
Glucose-Capillary: 51 mg/dL — ABNORMAL LOW (ref 65–99)
Glucose-Capillary: 225 mg/dL — ABNORMAL HIGH (ref 65–99)

## 2016-10-24 MED ORDER — SODIUM CHLORIDE 0.9 % IV BOLUS (SEPSIS)
500.0000 mL | Freq: Once | INTRAVENOUS | Status: AC
  Administered 2016-10-24: 500 mL via INTRAVENOUS

## 2016-10-24 MED ORDER — SODIUM CHLORIDE 0.9 % IV BOLUS (SEPSIS)
250.0000 mL | Freq: Once | INTRAVENOUS | Status: AC
  Administered 2016-10-24: 250 mL via INTRAVENOUS

## 2016-10-24 MED ORDER — MORPHINE SULFATE (PF) 2 MG/ML IV SOLN
2.0000 mg | INTRAVENOUS | Status: DC | PRN
  Filled 2016-10-24: qty 1

## 2016-10-24 NOTE — Progress Notes (Signed)
We were asked to see the patient however we were informed by hospitalist that patient is being transitioned to comfort care. Therefore she will no longer be requiring hemodialysis. Thanks for consultation.

## 2016-10-24 NOTE — Progress Notes (Signed)
Patient has been made comfort care, so will not perform consult at this time.  Please call with questions.

## 2016-10-24 NOTE — Care Management (Signed)
Patient presented from dialysis clinic with sx concerning for nstemi. 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  Patient is bing made comfort care and to transfer to 1C.  Notified Dimas Chyle with [employer] as patient is a known chronic dialysis patient.

## 2016-10-24 NOTE — Progress Notes (Signed)
[facility] - [place] at [facility]   PATIENT NAME: Renee Daugherty    MR#:  [id number]  DATE OF BIRTH:  [birth year]  SUBJECTIVE:  CHIEF COMPLAINT:   Chief Complaint  Patient presents with  . Code STEMI   - patient has dementia, appears comfortable, denies any complaints  REVIEW OF SYSTEMS:  Review of Systems  Unable to perform ROS: Dementia    DRUG ALLERGIES:   Allergies  Allergen Reactions  . Metformin     GI and renal problems  . Midodrine     Insomnia   . Tape Other (See Comments)    Rash, PT uses paper tape  . Levaquin [Levofloxacin In D5w] Rash    VITALS:  Blood pressure (!) 75/29, pulse 82, temperature 98.6 F (37 C), temperature source Oral, resp. rate 14, height  (1.626 m), weight 89.1 kg (196 lb 6.4 oz), SpO2 98 %.  PHYSICAL EXAMINATION:  Physical Exam  GENERAL:  83 y.o.-year-old patient lying in the bed with no acute distress.  EYES: Pupils equal, round, reactive to light and accommodation. No scleral icterus. Extraocular muscles intact.  HEENT: Head atraumatic, normocephalic. Oropharynx and nasopharynx clear.  NECK:  Supple, no jugular venous distention. No thyroid enlargement, no tenderness.  LUNGS: Normal breath sounds bilaterally, no wheezing, rales,rhonchi or crepitation. No use of accessory muscles of respiration.  CARDIOVASCULAR: S1, S2 normal. No   rubs, or gallops. 3/6 systolic murmur present ABDOMEN: Soft, nontender, nondistended. Bowel sounds present. No organomegaly or mass.  EXTREMITIES: No   cyanosis, or clubbing. 2+ bilateral leg edema, tender to touch NEUROLOGIC: Cranial nerves II through XII are intact. Following simple commands, no focal neurological deficits.  Sensation intact. Gait not checked.  PSYCHIATRIC: The patient is alert and oriented to self SKIN: No obvious rash, lesion, or ulcer.    LABORATORY PANEL:   CBC  Recent Labs Lab 10/24/2016 1137  WBC 7.6  HGB 12.0  HCT 35.3  PLT 247    ------------------------------------------------------------------------------------------------------------------  Chemistries   Recent Labs Lab 10/20/2016 1137  NA 138  K 3.5  CL 96*  CO2 29  GLUCOSE 173*  BUN 14  CREATININE 3.53*  CALCIUM 8.7*  AST 18  ALT <5*  ALKPHOS 198*  BILITOT 0.5   ------------------------------------------------------------------------------------------------------------------  Cardiac Enzymes  Recent Labs Lab 10/24/16 0318  TROPONINI 0.04*   ------------------------------------------------------------------------------------------------------------------  RADIOLOGY:  Ct Angio Chest/abd/pel For Dissection W And/or Wo Contrast  Result Date: 11/04/2016 CLINICAL DATA:  Acute onset chest and abdominal pain this morning while having dialysis. EXAM: CT ANGIOGRAPHY CHEST, ABDOMEN AND PELVIS TECHNIQUE: Multidetector CT imaging through the chest, abdomen and pelvis was performed using the standard protocol during bolus administration of intravenous contrast. Multiplanar reconstructed images and MIPs were obtained and reviewed to evaluate the vascular anatomy. CONTRAST:  100 cc Isovue 370. COMPARISON:  CT chest 12/31/2014. CT chest, abdomen and pelvis 11/14/2011. FINDINGS: CTA CHEST FINDINGS Cardiovascular: There is no aortic dissection or aneurysm. Calcific aortic and coronary atherosclerosis is noted. There is mild cardiomegaly. No pericardial effusion. Vascular stent in the left subclavian vein is noted. Mediastinum/Nodes: Coarse calcification and heterogeneous appearance of the left lobe of the thyroid is chronic. No hiatal hernia. No lymphadenopathy. Lungs/Pleura: No pleural effusion. The lungs demonstrate mild dependent atelectasis. Musculoskeletal: No fracture or focal bony abnormality. Mildly increased density most consistent with renal osteodystrophy noted. Review of the MIP images confirms the above findings. CTA ABDOMEN AND PELVIS FINDINGS VASCULAR  Aorta: No aortic dissection or aneurysm. Extensive  atherosclerosis noted. Celiac: High-grade stenosis, 80-90%, is seen just distal to the takeoff. The vessel is well opacified with contrast distal to the stenosis. SMA: Atherosclerotic calcification at the takeoff without stenosis noted. Renals: Patent. IMA: Widely patent. Veins: Unremarkable. Review of the MIP images confirms the above findings. NON-VASCULAR Hepatobiliary: Status post cholecystectomy. No focal liver lesion. Pneumobilia is consistent with prior sphincterotomy is chronic and unchanged since the prior chest CT. Pancreas: Unremarkable. No pancreatic ductal dilatation or surrounding inflammatory changes. Spleen: Normal in size without focal abnormality. Adrenals/Urinary Tract: Markedly atrophic kidneys consistent with chronic renal failure noted. The kidneys are otherwise normal. The urinary bladder is completely decompressed but otherwise unremarkable. Stomach/Bowel: Diverticulosis without diverticulitis is most notable in the sigmoid. The colon is otherwise unremarkable. The stomach and small bowel appear normal. There is no evidence of appendicitis. Lymphatic: As described above.  Otherwise unremarkable. Reproductive: Uterus and bilateral adnexa are unremarkable. Other: Tiny fat containing supraumbilical hernia is noted. There is some laxity of the anterior abdominal wall. No ascites. Musculoskeletal: Bones demonstrate mildly increased density throughout most consistent with renal osteodystrophy. There is convex left lumbar scoliosis and spondylosis. Review of the MIP images confirms the above findings. IMPRESSION: No acute abnormality chest, abdomen or pelvis. Negative for aortic dissection or aneurysm. Short segment high-grade stenosis just distal to the takeoff of the celiac axis. Cardiomegaly. Diverticulosis without diverticulitis. Electronically Signed   By: Drusilla Kanner M.D.   On: 11/14/2016 12:26    EKG:   Orders placed or performed  during the hospital encounter of November 14, 2016  . EKG 12-Lead  . EKG 12-Lead  . EKG 12-Lead  . EKG 12-Lead  . EKG 12-Lead  . EKG 12-Lead    ASSESSMENT AND PLAN:   83 year old female with past medical history significant for dementia, end-stage renal disease on Tuesday Thursday Saturday hemodialysis, Parkinson's disease comes to hospital secondary to chest pain and abdominal pain  #1 chest pain-likely unstable angina -Admitted to telemetry, appreciate cardiology consult -Since troponins are plateaued, family has decided against further aggressive workup due to underlying multiple medical problems. -Patient has remained hypotensive today. IV fluid boluses as needed. -Beta blockers held due to hypotension, continue aspirin.  #2 abdominal pain-secondary to high-grade celiac artery stenosis -Vascular consult requested, however if family is going to with Comfort Care-we will avoid any further procedures  #3 end-stage renal disease on hemodialysis-did not finish up her dialysis yesterday -Nephrology consulted. Her blood pressure has been low and receiving fluid boluses at this time  #4 Hypotension- to be cardiogenic shock from NSTEMI -Fluid bolus as needed and monitor closely - If family wants further mgmt and trt- gave option for echo, transfer to ICU for pressors and sepsis work up as well- daughter wants to discuss with patients son and decide further- for now- continue fluid bolus  #5 Parkinson's and dementia-continue outpatient medications  #6 diabetes mellitus-hold glipizide as patient is not able to eat anything  #7 subcutaneous heparin for DVT prophylaxis  Discussed with daughter at bedside, waiting sounds arrival. Started leaning more towards comfort care at this time.    All the records are reviewed and case discussed with Care Management/Social Workerr. Management plans discussed with the patient, family and they are in agreement.  CODE STATUS: DNR  TOTAL TIME TAKING CARE  OF THIS PATIENT: 45 minutes.      Feliberto Stockley M.D on 10/24/2016 at 9:20 AM  Between 7am to 6pm - Pager - [phone]  After 6pm go to [email or web address] - password [email or web address]  [facility]  [facility]  Office  [phone]  CC: Primary care physician; Jerl Mina, MD

## 2016-10-24 NOTE — Progress Notes (Signed)
BP this am 82/38. CBG 51. Pt states she feels weak and she is diaphoretic. Orange juice and graham crackers provided. Paged Dr. Mack Hook and received order for 500 ml bolus which is hanging now. Will continue to monitor.

## 2016-10-24 NOTE — Progress Notes (Signed)
Report called to nurse on 1C. Pt's daughter informed of new room number. Belongings gathered and pt taken down to room 122 via bed by nurse tech.

## 2016-10-24 NOTE — Progress Notes (Signed)
Extensive discussion with patient's son and daughter at bedside. -Discussed the current diagnosis, possible prognosis. -Family mentioned that patient is tired and would not want to continue on dialysis further. -they were in unison about keeping her comfortable and maybe hospice services -We'll discontinue telemetry, continue the current fluid bolus and transferred to 1 c for comfort care.

## 2016-10-25 MED ORDER — FLUDROCORTISONE ACETATE 0.1 MG PO TABS
0.1000 mg | ORAL_TABLET | Freq: Every day | ORAL | Status: DC
  Administered 2016-10-25: 0.1 mg via ORAL
  Filled 2016-10-25 (×2): qty 1

## 2016-10-25 NOTE — Care Management (Addendum)
Received referral for hospice services in the home. Spoke with daughter, Chyrl Civatte and son Jillyn Hidden. Daughter upset. "I can't take care of mama." Discussed the personal care list. "I can't effort these services." Discussed that Hospice would be available as needed. Discussed Hospice agencies in the home. Chose [facility] [address]. Will update Dayna Barker RN representative. Discussed that Clydie Braun could discuss what services could be offered in the home. Gwenette Greet RN MSN CCM Care Management [phone]

## 2016-10-25 NOTE — Progress Notes (Signed)
Chaplain made a follow up with pt and met pt and family at bedside. Pt's family stated that pt was more active yesterday than she is today. Family stated they are comfort with the progress that pt is making and are looking forward to the best outcome and further direction. Pt told [place] she had a lot of preaches come through to see her and pray for her and was grateful that the [facility] visited with her. [place] offered a spiritual support and pastoral presence.   10/24/16 0701  Clinical Encounter Type  Visited With Patient and family together  Visit Type Follow-up;Spiritual support  Referral From Chaplain  Consult/Referral To [facility] (Comment)  Advance Directives (For Healthcare)  Healthcare Power of Attorney Requested and Now in Chart Copy in chart

## 2016-10-25 NOTE — Care Management Important Message (Signed)
Important Message  Patient Details  Name: Renee Daugherty MRN: [id number] Date of Birth: February 07, 1926   Medicare Important Message Given:  Yes    Gwenette Greet, RN 10/25/2016, 7:18 AM

## 2016-10-25 NOTE — Progress Notes (Addendum)
[facility] [place] at [facility]   PATIENT NAME: Renee Daugherty    MR#:  [id number]  DATE OF BIRTH:  1926/03/03  SUBJECTIVE:  CHIEF COMPLAINT:   Chief Complaint  Patient presents with  . Code STEMI   - Patient is more alert today due to blood pressure is still low. -She wants to continue dialysis at this time for a few more sessions. Dr. Cherylann Ratel notified  REVIEW OF SYSTEMS:  Review of Systems  Constitutional: Positive for malaise/fatigue. Negative for chills and fever.  HENT: Negative for hearing loss.   Eyes: Negative for blurred vision and double vision.  Respiratory: Negative for cough, shortness of breath and wheezing.   Cardiovascular: Negative for chest pain and palpitations.  Gastrointestinal: Positive for abdominal pain. Negative for constipation, diarrhea, nausea and vomiting.  Genitourinary: Negative for dysuria.  Neurological: Negative for dizziness, speech change, focal weakness, seizures and headaches.  Psychiatric/Behavioral: Negative for depression.    DRUG ALLERGIES:   Allergies  Allergen Reactions  . Metformin     GI and renal problems  . Midodrine     Insomnia   . Tape Other (See Comments)    Rash, PT uses paper tape  . Levaquin [Levofloxacin In D5w] Rash    VITALS:  Blood pressure (!) 97/37, pulse 80, temperature 98 F (36.7 C), temperature source Oral, resp. rate 20, height  (1.575 m), weight 91.8 kg (202 lb 4.8 oz), SpO2 100 %.  PHYSICAL EXAMINATION:  Physical Exam  GENERAL:  83 y.o.-year-old patient lying in the bed with no acute distress.  EYES: Pupils equal, round, reactive to light and accommodation. No scleral icterus. Extraocular muscles intact.  HEENT: Head atraumatic, normocephalic. Oropharynx and nasopharynx clear.  NECK:  Supple, no jugular venous distention. No thyroid enlargement, no tenderness.  LUNGS: Normal breath sounds bilaterally, no wheezing, rales,rhonchi or crepitation. No use of accessory muscles  of respiration.  CARDIOVASCULAR: S1, S2 normal. No   rubs, or gallops. 3/6 systolic murmur present ABDOMEN: Soft, nontender, nondistended. Bowel sounds present. No organomegaly or mass.  EXTREMITIES: No   cyanosis, or clubbing. 2+ bilateral leg edema, tender to touch NEUROLOGIC: Cranial nerves II through XII are intact. Following simple commands, no focal neurological deficits.  Sensation intact. Gait not checked.  PSYCHIATRIC: The patient is alert and oriented x 3 today SKIN: No obvious rash, lesion, or ulcer.    LABORATORY PANEL:   CBC  Recent Labs Lab 10/29/2016 1137  WBC 7.6  HGB 12.0  HCT 35.3  PLT 247   ------------------------------------------------------------------------------------------------------------------  Chemistries   Recent Labs Lab 10/16/2016 1137  NA 138  K 3.5  CL 96*  CO2 29  GLUCOSE 173*  BUN 14  CREATININE 3.53*  CALCIUM 8.7*  AST 18  ALT <5*  ALKPHOS 198*  BILITOT 0.5   ------------------------------------------------------------------------------------------------------------------  Cardiac Enzymes  Recent Labs Lab 10/24/16 0318  TROPONINI 0.04*   ------------------------------------------------------------------------------------------------------------------  RADIOLOGY:  No results found.  EKG:   Orders placed or performed during the hospital encounter of 11/10/2016  . EKG 12-Lead  . EKG 12-Lead  . EKG 12-Lead  . EKG 12-Lead  . EKG 12-Lead  . EKG 12-Lead    ASSESSMENT AND PLAN:   83 year old female with past medical history significant for dementia, end-stage renal disease on Tuesday Thursday Saturday hemodialysis, Parkinson's disease comes to hospital secondary to chest pain and abdominal pain  #1 chest pain-unstable angina appreciate cardiology consult -Since troponins are plateaued, family has decided against further aggressive  workup due to underlying multiple medical problems. - The pressure is better than  yesterday. No further cardiac workup. -Beta blockers held due to hypotension, continue aspirin.  #2 abdominal pain-secondary to high-grade celiac artery stenosis -Due to multiple issues, vascular was consulted. However family did not want aggressive measures of will hold off on further intervention  #3 end-stage renal disease on hemodialysis-did not finish up her dialysis prior to admission -Nephrology consulted. Her blood pressure has been low and received fluid boluses yesterday' - patient wants to continue dialysis for a few more sessions. Continue following as needed  #4 Hypotension- to be cardiogenic shock from NSTEMI -Received fluid bolus yesterday, and is not able to get dialysis today due to hypotension-some crackles heard. Hold off on fluid boluses. -Since allergy to midodrine, we'll add Florinef  #5 Parkinson's and dementia-continue outpatient medications  #6 diabetes mellitus-hold glipizide and actos as patient is not able to eat anything  #7 subcutaneous heparin for DVT prophylaxis  Discussed with multiple family members today as well. As of yesterday due to declining clinical condition-they have opted no further dialysis and comfort measures. Hospice has been consulted. However patient is more alert and wants to continue dialysis if blood pressure allows. Florinef started and continue to monitor. She still remains DO NOT RESUSCITATE and no further aggressive escalation of care.   All the records are reviewed and case discussed with Care Management/Social Workerr. Management plans discussed with the patient, family and they are in agreement.  CODE STATUS: DNR  TOTAL TIME TAKING CARE OF THIS PATIENT: .      Enid Baas M.D on 10/25/2016 at 1:52 PM  Between 7am to 6pm - Pager - [phone]  After 6pm go to [email or web address] - Social research officer, government  [facility]  Office  [phone]  CC: Primary care physician; Jerl Mina, MD

## 2016-10-25 NOTE — Progress Notes (Signed)
Inpatient Diabetes Program Recommendations  AACE/ADA: New Consensus Statement on Inpatient Glycemic Control (2015)  Target Ranges:  Prepandial:   less than 140 mg/dL      Peak postprandial:   less than 180 mg/dL (1-2 hours)      Critically ill patients:  140 - 180 mg/dL   Results for TYARA, DASSOW (MRN [id number]) as of 10/25/2016 10:18  Ref. Range 11/03/2016 15:31 11/01/2016 20:46 10/24/2016 07:37 10/24/2016 09:20 10/24/2016 11:48  Glucose-Capillary Latest Ref Range: 65 - 99 mg/dL 409 (H) 811 (H) 51 (L) 225 (H) 338 (H)   Review of Glycemic Control  Diabetes history: DM2 Outpatient Diabetes medications: Glipizide XL 10 mg BID, Actos 45 mg QHS Current orders for Inpatient glycemic control: Actos 45 mg QHS  Inpatient Diabetes Program Recommendations:  Correction (SSI): Noted patient is comfort care. If appropriate, may want to consider ordering Novolog correction scale while inpatient. Oral Agents: Please discontinue Actos while inpatient.  Thanks, Orlando Penner, RN, MSN, CDE Diabetes Coordinator Inpatient Diabetes Program [phone] (Team Pager from 8am to 5pm)

## 2016-10-25 NOTE — Progress Notes (Signed)
New referral for [facility] services received from [facility]. Writer met with patient's son Hoy Morn, daughter Arville Go and 2 daughter in laws and grand son. After much discussion family felt that they still had some unanswered questions, mainly regarding prognosis. They feel that taking her home is not an option and we did discuss the hospice home vs home with hospice. They also expressed concern that they nor the physicians had not asked their mother what she wanted and they felt strongly that this should be done. Writer assured them that attending physician Dr. Tressia Miners, if requested,  would speak with their mother and the family together. Emotional support given. Dr. Tressia Miners notified of family concerns and will speak with them again. Writer also spoke with Dr. Holley Raring and made him aware of family concerns. [place] updated. Will continue to shadow until a decision is made. Thank you. Flo Shanks RN, BSN, [facility] [place], hospital Liaison [phone] c

## 2016-10-26 LAB — GLUCOSE, CAPILLARY: GLUCOSE-CAPILLARY: 114 mg/dL — AB (ref 65–99)

## 2016-10-26 MED ORDER — HEPARIN SODIUM (PORCINE) 5000 UNIT/ML IJ SOLN: 5000.0000 [IU] | Freq: Three times a day (TID) | INTRAMUSCULAR | Status: DC

## 2016-10-26 MED ORDER — LORAZEPAM 2 MG/ML IJ SOLN: 1.0000 mg | INTRAMUSCULAR | Status: DC | PRN

## 2016-10-26 MED ORDER — INSULIN ASPART 100 UNIT/ML ~~LOC~~ SOLN: 0.0000 [IU] | Freq: Every day | SUBCUTANEOUS | Status: DC

## 2016-10-26 MED ORDER — INSULIN ASPART 100 UNIT/ML ~~LOC~~ SOLN: 0.0000 [IU] | Freq: Three times a day (TID) | SUBCUTANEOUS | Status: DC

## 2016-10-26 MED ORDER — MIDODRINE HCL 5 MG PO TABS
10.0000 mg | ORAL_TABLET | Freq: Three times a day (TID) | ORAL | Status: DC
  Administered 2016-10-26: 11:00:00 10 mg via ORAL
  Filled 2016-10-26 (×3): qty 2

## 2016-10-26 NOTE — Progress Notes (Signed)
[facility] - [place] at [facility]   PATIENT NAME: Renee Daugherty    MR#:  [id number]  DATE OF BIRTH:  06-16-1926  SUBJECTIVE: seen at the bedside,patient denies any complaints, still hypotensive multiple family members  In room.  CHIEF COMPLAINT:   Chief Complaint  Patient presents with  . Code STEMI   - Patient is more alert today due to blood pressure is still low. -She wants to continue dialysis at this time for a few more sessions. Dr. Cherylann Ratel notified  REVIEW OF SYSTEMS:  Review of Systems  Constitutional: Positive for malaise/fatigue. Negative for chills and fever.  HENT: Negative for hearing loss.   Eyes: Negative for blurred vision and double vision.  Respiratory: Negative for cough, shortness of breath and wheezing.   Cardiovascular: Negative for chest pain and palpitations.  Gastrointestinal: Positive for abdominal pain. Negative for constipation, diarrhea, nausea and vomiting.  Genitourinary: Negative for dysuria.  Neurological: Negative for dizziness, speech change, focal weakness, seizures and headaches.  Psychiatric/Behavioral: Negative for depression.    DRUG ALLERGIES:   Allergies  Allergen Reactions  . Metformin     GI and renal problems  . Midodrine     Insomnia   . Tape Other (See Comments)    Rash, PT uses paper tape  . Levaquin [Levofloxacin In D5w] Rash    VITALS:  Blood pressure (!) 75/35, pulse (!) 56, temperature 98.4 F (36.9 C), temperature source Oral, resp. rate 20, height  (1.575 m), weight 91.8 kg (202 lb 4.8 oz), SpO2 97 %.  PHYSICAL EXAMINATION:  Physical Exam  GENERAL:  83 y.o.-year-old patient lying in the bed with no acute distress.  EYES: Pupils equal, round, reactive to light and accommodation. No scleral icterus. Extraocular muscles intact.  HEENT: Head atraumatic, normocephalic. Oropharynx and nasopharynx clear.  NECK:  Supple, no jugular venous distention. No thyroid enlargement, no tenderness.    LUNGS: Normal breath sounds bilaterally, no wheezing, rales,rhonchi or crepitation. No use of accessory muscles of respiration.  CARDIOVASCULAR: S1, S2 normal. No   rubs, or gallops. 3/6 systolic murmur present ABDOMEN: Soft, nontender, nondistended. Bowel sounds present. No organomegaly or mass.  EXTREMITIES: No   cyanosis, or clubbing. 2+ bilateral leg edema, tender to touch NEUROLOGIC: Cranial nerves II through XII are intact. Following simple commands, no focal neurological deficits.  Sensation intact. Gait not checked.  PSYCHIATRIC: The patient is alert and oriented x 3 today SKIN: No obvious rash, lesion, or ulcer.    LABORATORY PANEL:   CBC  Recent Labs Lab 11-12-16 1137  WBC 7.6  HGB 12.0  HCT 35.3  PLT 247   ------------------------------------------------------------------------------------------------------------------  Chemistries   Recent Labs Lab 11/12/2016 1137  NA 138  K 3.5  CL 96*  CO2 29  GLUCOSE 173*  BUN 14  CREATININE 3.53*  CALCIUM 8.7*  AST 18  ALT <5*  ALKPHOS 198*  BILITOT 0.5   ------------------------------------------------------------------------------------------------------------------  Cardiac Enzymes  Recent Labs Lab 10/24/16 0318  TROPONINI 0.04*   ------------------------------------------------------------------------------------------------------------------  RADIOLOGY:  No results found.  EKG:   Orders placed or performed during the hospital encounter of 12-Nov-2016  . EKG 12-Lead  . EKG 12-Lead  . EKG 12-Lead  . EKG 12-Lead  . EKG 12-Lead  . EKG 12-Lead    ASSESSMENT AND PLAN:   83 year old female with past medical history significant for dementia, end-stage renal disease on Tuesday Thursday Saturday hemodialysis, Parkinson's disease comes to hospital secondary to chest pain and abdominal pain  #  1 chest pain-unstable angina appreciate cardiology consult -Since troponins are plateaued, family has decided  against further aggressive workup due to underlying multiple medical problems. - The pressure is better than yesterday. No further cardiac workup. -Beta blockers held due to hypotension, continue aspirin.  #2 abdominal pain-secondary to high-grade celiac artery stenosis -Due to multiple issues, vascular was consulted. However family did not want aggressive measures of will hold off on further intervention  #3 end-stage renal disease on hemodialysis-did not finish up her dialysis prior to admission -Nephrology consulted. H For hypotension ,started on midodrine.  #4 Hypotension- to be cardiogenic shock from NSTEMI   #5 Parkinson's and dementia-continue outpatient medications  #6 diabetes mellitus-hold glipizide and actos as patient is not able to eat anything, continue sliding scale insulin with coverage only.  #7 subcutaneous heparin for DVT prophylaxis  Discussed with multiple family members today as well. As of yesterday due to declining clinical condition-they have opted no further dialysis and comfort measures. Hospice has been consulted. However patient is more alert and wants to continue dialysis if blood pressure allows.  For hypotension measured and has been started, aAll the records are reviewed and case discussed with Care Management/Social Workerr. Management plans discussed with the patient, family and they are in agreement.  CODE STATUS: DNR  TOTAL TIME TAKING CARE OF THIS PATIENT: .      Katha Hamming M.D on 10/26/2016 at 10:51 AM  Between 7am to 6pm - Pager - [phone]  After 6pm go to [email or web address] - Social research officer, government  [facility] [place] [facility]  Office  [phone]  CC: Primary care physician; Jerl Mina, MD

## 2016-10-26 NOTE — Progress Notes (Signed)
Writer met again with patient's son Hoy Morn and daughter Arville Go, they have chosen to focus on comfort and would like their mother transferred to the hospice home. There is currently a waiting list and family is aware. Consent signed and patient placed on the wait list. Both Hoy Morn and Arville Go made it very clear that they wanted their mother comfortable, "no pain, no rouble breathing". We discussed comfort medications and focus being on symptoms rather than numbers. They were in agreement. This information was shared with staff RN Jinny Blossom who will address with attending physician. Weekend Education officer, museum to follow up with Hospice home for bed availability. Family aware of wait list. Thank you. Flo Shanks RN, BSN, [facility] [place], hospital Liaison [phone] c

## 2016-10-26 NOTE — Progress Notes (Signed)
[facility]  [place]ING [place]Subjective:  We are asked to come back and see the patient. Several days ago it was decided tha she would be proceeding with hospice. However family wanted to elicit further input from the patient since she is more awake and alert now. Over the past 2 days she stated that she wanted to continue with hemodialysis. However her blood pressures have remained quite low. Currently blood pressure 75/35 despite initiation of midodrine. We had a long discussion with the patient and her family that dialysis would likely be difficult to performand and poses a significant danger to her given her low blood pressure.   Objective:  Vital signs in last 24 hours:  Temp:  [98.4 F (36.9 C)-99 F (37.2 C)] 98.4 F (36.9 C) (10/12 1044) Pulse Rate:  [56-85] 56 (10/12 1044) BP: (75-95)/(35-64) 75/35 (10/12 1044) SpO2:  [97 %] 97 % (10/12 1044)  Weight change:  Filed Weights   11-16-2016 1600 10/24/16 0356 10/24/16 1124  Weight: 88.7 kg (195 lb 8 oz) 89.1 kg (196 lb 6.4 oz) 91.8 kg (202 lb 4.8 oz)    Intake/Output: No intake/output data recorded.   Intake/Output this shift:  Total I/O In: 120 [P.O.:120] Out: -   Physical Exam: General: No acute distress  Head: Normocephalic, atraumatic. Moist oral mucosal membranes  Eyes: Anicteric  Neck: Supple, trachea midline  Lungs:  Clear to auscultation, normal effort  Heart: S1S2 no rubs  Abdomen:  Soft, nontender, bowel sounds present  Extremities: 1+ peripheral edema.  Neurologic: Awake, alert, following commands  Skin: No lesions  Access: LUE access    Basic Metabolic Panel:  Recent Labs Lab November 16, 2016 1137  NA 138  K 3.5  CL 96*  CO2 29  GLUCOSE 173*  BUN 14  CREATININE 3.53*  CALCIUM 8.7*    Liver Function Tests:  Recent Labs Lab November 16, 2016 1137  AST 18  ALT <5*  ALKPHOS 198*  BILITOT 0.5  PROT 7.6  ALBUMIN 3.9    Recent Labs Lab 2016/11/16 1137  LIPASE 43   No results for  input(s): AMMONIA in the last 168 hours.  CBC:  Recent Labs Lab 11-16-16 1137  WBC 7.6  NEUTROABS 6.2  HGB 12.0  HCT 35.3  MCV 97.0  PLT 247    Cardiac Enzymes:  Recent Labs Lab 11-16-16 1137 11-16-2016 1549 11-16-16 2125 10/24/16 0318  TROPONINI <0.03 0.03* 0.03* 0.04*    BNP: Invalid input(s): POCBNP  CBG:  Recent Labs Lab 11/16/16 1531 16-Nov-2016 2046 10/24/16 0737 10/24/16 0920 10/24/16 1148  GLUCAP 155* 131* 51* 225* 338*    Microbiology: No results found for this or any previous visit.  Coagulation Studies: No results for input(s): LABPROT, INR in the last 72 hours.  Urinalysis: No results for input(s): COLORURINE, LABSPEC, PHURINE, GLUCOSEU, HGBUR, BILIRUBINUR, KETONESUR, PROTEINUR, UROBILINOGEN, NITRITE, LEUKOCYTESUR in the last 72 hours.  Invalid input(s): APPERANCEUR    Imaging: No results found.   Medications:    . aspirin EC  81 mg Oral Daily  . atorvastatin  40 mg Oral q1800  . calcium acetate  1,334 mg Oral TID WC  . carbidopa-levodopa  1 tablet Oral TID  . cinacalcet  30 mg Oral Q T,Th,Sat-1800  . donepezil  10 mg Oral QHS  . fluticasone  2 spray Each Nare Daily  . gabapentin  200 mg Oral QHS  . heparin subcutaneous  5,000 Units Subcutaneous Q8H  . insulin aspart  0-5 Units Subcutaneous QHS  .  insulin aspart  0-9 Units Subcutaneous TID WC  . memantine  10 mg Oral BID  . midodrine  10 mg Oral TID WC  . mometasone-formoterol  2 puff Inhalation BID  . pantoprazole  40 mg Oral Daily  . PARoxetine  10 mg Oral Daily  . pregabalin  150 mg Oral Daily  . traZODone  100 mg Oral QHS   acetaminophen **OR** acetaminophen, albuterol, morphine injection, ondansetron **OR** ondansetron (ZOFRAN) IV, polyethylene glycol  Assessment/ Plan:  83 y.o. female with past medical history of end-stage renal disease diabetes mellitus type 2, GERD, lymphedema, Parkinson's disease, anemia chronic kidney disease, secondary hyperparathyroid is him,  peripheral vascular disease, and depression who was admitted with chest pain.  1. ESRD on HD. 2.Chest pain at admission. 3. Hypotension. 4. Anemia of chronic kidney disease. 5. Secondary hyperparathyroidism.  Plan: We are asked to reevaluate the patient for hemodialysis. She continues to have significant hypotension and blood pressure is currently 75/35 despite use of midodrine 10 mg by mouth 3 times a day.  We suspect that she is having some element of autonomic neuropathy from her underlying Parkinson's disease and consequent hypotension. With a blood pressure 75/35 dialysis poses some significant risks to the patient. Patient has been started on midodrine 10 mg by mouth 3 times a day. We counseled the patient and her family on these risks.At this point in time we cannot safelymove forward with dialysis. We have notified the patient's physician as well as hospice care of this issue.   LOS: 3 Rien Marland 10/12/20181:12 PM

## 2016-10-27 LAB — GLUCOSE, CAPILLARY: GLUCOSE-CAPILLARY: 77 mg/dL (ref 65–99)

## 2016-10-27 MED ORDER — GLYCOPYRROLATE 0.2 MG/ML IJ SOLN
0.1000 mg | INTRAMUSCULAR | Status: DC
  Filled 2016-10-27 (×4): qty 0.5

## 2016-10-29 ENCOUNTER — Other Ambulatory Visit (INDEPENDENT_AMBULATORY_CARE_PROVIDER_SITE_OTHER): Payer: Medicare Other

## 2016-10-29 ENCOUNTER — Ambulatory Visit (INDEPENDENT_AMBULATORY_CARE_PROVIDER_SITE_OTHER): Payer: Medicare Other | Admitting: Vascular Surgery

## 2016-11-15 NOTE — Progress Notes (Signed)
Pt expired at 10,55 am today,

## 2016-11-15 NOTE — Progress Notes (Signed)
Pt passed away at 1055 this morning, family was at the bedside. All belongings were returned to the daughter Everett Graff. [facility]or services were called, representative Cammy Brochure took the call, pt is a potential donor for tissue. Service number [id number]. Family is still present at the bedside, post mortem care will be completed once they are ready. Supervisor, and Md were made aware of the patient's death.

## 2016-11-15 NOTE — Discharge Summary (Signed)
    Death Note please see Last Note for all details.   In breif -E 83 year old female patient with past medical history of ESRD and hemodialysis Tuesday, Thursday, Saturday came into hospital because of chest pain, abdominal pain. pt is admitted to hospital for chest pain and unstable angina Patient has multiple medical problems of advanced age, ESRD,  cardiologist  decided medical management because of underlying multiple medical problems  and also of troponins plateaued.abdominal pain and high-grade celiac artery stenosis vascular is consulted but the family wanted to hold off on now aggressive treatment, for ESRD patient to follow hypotensive and dialysis could not be performed because of hypotension. Family chose comfort measures, patient expired on August 13the entire 10:55 AM.    Renee Daugherty CSN:[id number],MRN:[id number] is a 83 y.o. female, Outpatient Primary MD for the patient is Jerl MinaHedrick, James, MD  Pronounced dead by on August 13  byregistered nurse at 10:55 AM.           Cause of death  Hypotension Unstable angina Abdominal pain ESRD  Total clinical and documentation time for today Under 30 minutes   Last Note

## 2016-11-15 NOTE — Progress Notes (Signed)
[facility] - [place] [facility]   PATIENT NAME: Krisy Dix    MR#:  [id number]  DATE OF BIRTH:  [birth year]  SUBJECTIVE: complete comfort care measures now. Waiting for hospice bed.  CHIEF COMPLAINT:   Chief Complaint  Patient presents with  . Code STEMI   -  REVIEW OF SYSTEMS:  Review of Systems  Constitutional: Positive for malaise/fatigue. Negative for chills and fever.  HENT: Negative for hearing loss.   Eyes: Negative for blurred vision and double vision.  Respiratory: Negative for cough, shortness of breath and wheezing.   Cardiovascular: Negative for chest pain and palpitations.  Gastrointestinal: Positive for abdominal pain. Negative for constipation, diarrhea, nausea and vomiting.  Genitourinary: Negative for dysuria.  Neurological: Negative for dizziness, speech change, focal weakness, seizures and headaches.  Psychiatric/Behavioral: Negative for depression.    DRUG ALLERGIES:   Allergies  Allergen Reactions  . Metformin     GI and renal problems  . Midodrine     Insomnia   . Tape Other (See Comments)    Rash, PT uses paper tape  . Levaquin [Levofloxacin In D5w] Rash    VITALS:  Blood pressure 97/81, pulse (!) 106, temperature 98.6 F (37 C), temperature source Oral, resp. rate 16, height  (1.575 m), weight 91.8 kg (202 lb 4.8 oz), SpO2 91 %.  PHYSICAL EXAMINATION:  Physical Exam  GENERAL:  83 y.o.-year-old patient lying in the bed with no acute distress.  EYES: Pupils equal, round, reactive to light and accommodation. No scleral icterus. Extraocular muscles intact.  HEENT: Head atraumatic, normocephalic. Oropharynx and nasopharynx clear.  NECK:  Supple, no jugular venous distention. No thyroid enlargement, no tenderness.  LUNGS: Normal breath sounds bilaterally, no wheezing, rales,rhonchi or crepitation. No use of accessory muscles of respiration.  CARDIOVASCULAR: S1, S2 normal. No   rubs, or gallops. 3/6 systolic murmur  present ABDOMEN: Soft, nontender, nondistended. Bowel sounds present. No organomegaly or mass.  EXTREMITIES: No   cyanosis, or clubbing. 2+ bilateral leg edema, tender to touch NEUROLOGIC: Cranial nerves II through XII are intact. Following simple commands, no focal neurological deficits.  Sensation intact. Gait not checked.  PSYCHIATRIC: The patient is alert and oriented x 3 today SKIN: No obvious rash, lesion, or ulcer.    LABORATORY PANEL:   CBC  Recent Labs Lab Nov 20, 2016 1137  WBC 7.6  HGB 12.0  HCT 35.3  PLT 247   ------------------------------------------------------------------------------------------------------------------  Chemistries   Recent Labs Lab 11-20-16 1137  NA 138  K 3.5  CL 96*  CO2 29  GLUCOSE 173*  BUN 14  CREATININE 3.53*  CALCIUM 8.7*  AST 18  ALT <5*  ALKPHOS 198*  BILITOT 0.5   ------------------------------------------------------------------------------------------------------------------  Cardiac Enzymes  Recent Labs Lab 10/24/16 0318  TROPONINI 0.04*   ------------------------------------------------------------------------------------------------------------------  RADIOLOGY:  No results found.  EKG:   Orders placed or performed during the hospital encounter of 11/20/2016  . EKG 12-Lead  . EKG 12-Lead  . EKG 12-Lead  . EKG 12-Lead  . EKG 12-Lead  . EKG 12-Lead    ASSESSMENT AND PLAN:   83 year old female with past medical history significant for dementia, end-stage renal disease on Tuesday Thursday Saturday hemodialysis, Parkinson's disease comes to hospital secondary to chest pain and abdominal pain   1. chest pain, ESRD on hemodialysis, abdominal pain, diabetes mellitus type 2, Parkinson's dementia, hypotension: Family chose complete comfort care measures waiting for hospice bed. Continue morphine, Ativan and follow comfort measures. Transfer to hospice  home in the bed is available.  , All the records are reviewed  and case discussed with Care Management/Social Workerr. Management plans discussed with the patient, family and they are in agreement.  CODE STATUS: DNR  TOTAL TIME TAKING CARE OF THIS PATIENT: .      Katha Hamming M.D on 06-Nov-2016 at 8:36 AM  Between 7am to 6pm - Pager - [phone]  After 6pm go to [email or web address] - Social research officer, government  [facility]  Office  [phone]  CC: Primary care physician; Jerl Mina, MD

## 2016-11-15 DEATH — deceased

## 2019-07-09 IMAGING — CT CT ANGIO CHEST-ABD-PELV FOR DISSECTION W/ AND WO/W CM
2 of 7 series · 15 of 46 positions shown, 17 images · IV contrast (APPLIED)
Comparison: CT chest 12/31/2014. CT chest, abdomen and pelvis
11/14/2011.

CLINICAL DATA: Acute onset chest and abdominal pain this morning
while having dialysis.

EXAM:
CT ANGIOGRAPHY CHEST, ABDOMEN AND PELVIS
TECHNIQUE: Multidetector CT imaging through the chest, abdomen and pelvis was
performed using the standard protocol during bolus administration of
intravenous contrast. Multiplanar reconstructed images and MIPs were
obtained and reviewed to evaluate the vascular anatomy.
CONTRAST:  100 cc Isovue 370.

[Series 5: axial arterial · axial · arterial · 0.90mm/px · z∈[-641,-122]mm · 12 of 203 slices shown, 14 images]
[im 15/203  soft-tissue]
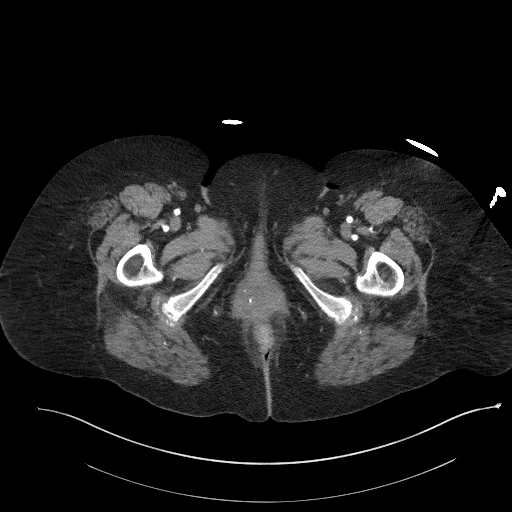
[im 15/203  bone]
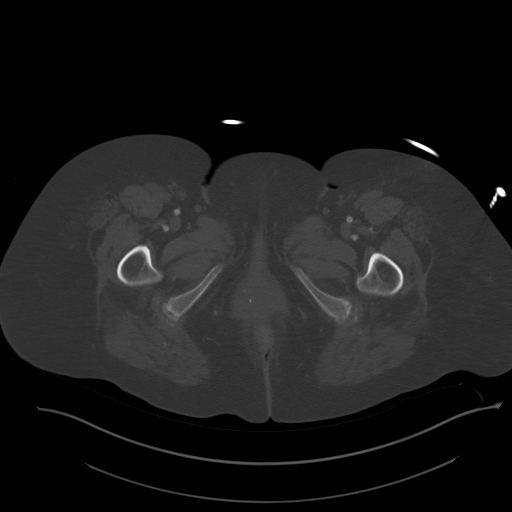
[im 29/203  soft-tissue]
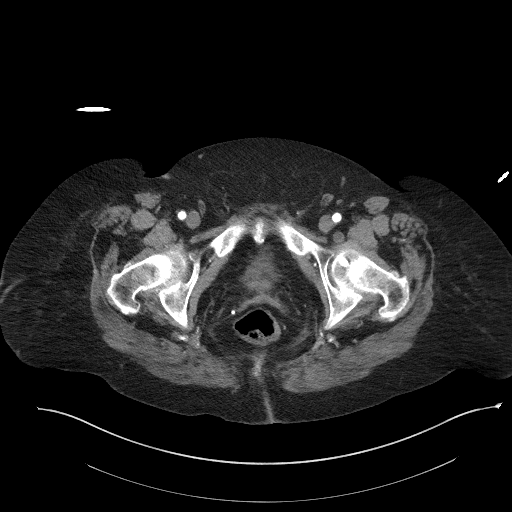
[im 44/203  soft-tissue]
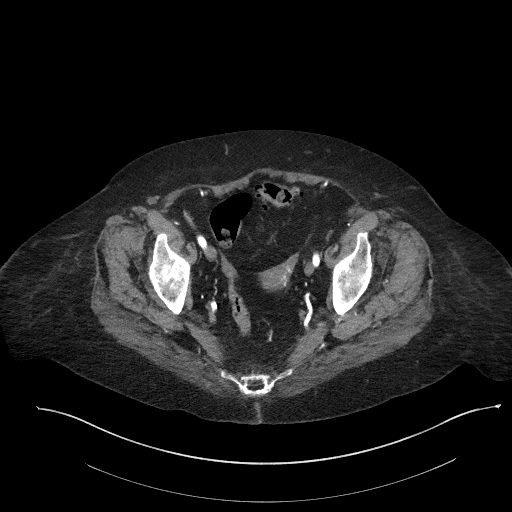
[im 58/203  soft-tissue]
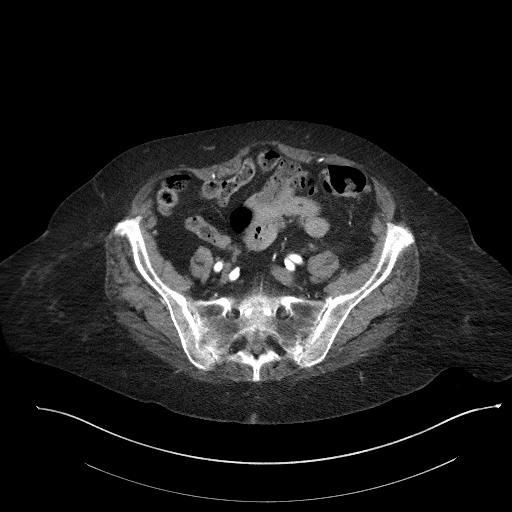
[im 73/203  soft-tissue]
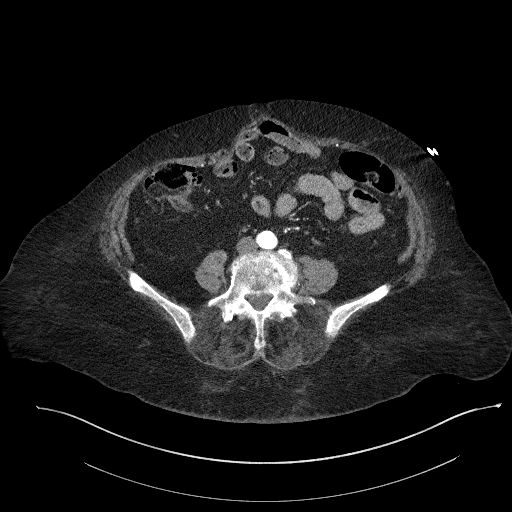
[im 87/203  soft-tissue]
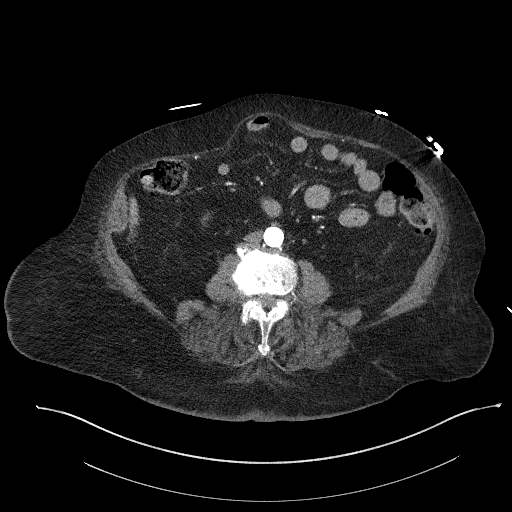
[im 116/203  soft-tissue]
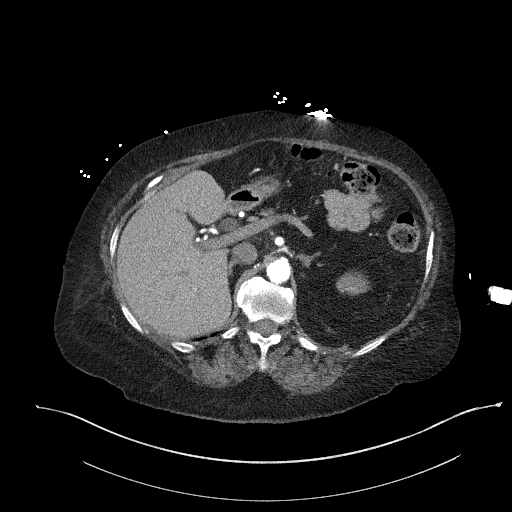
[im 130/203  soft-tissue]
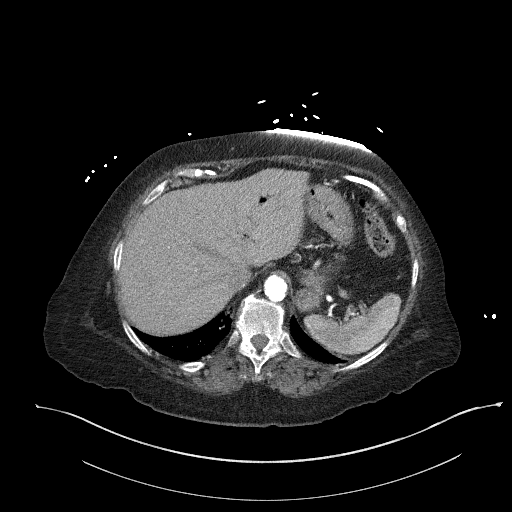
[im 145/203  soft-tissue]
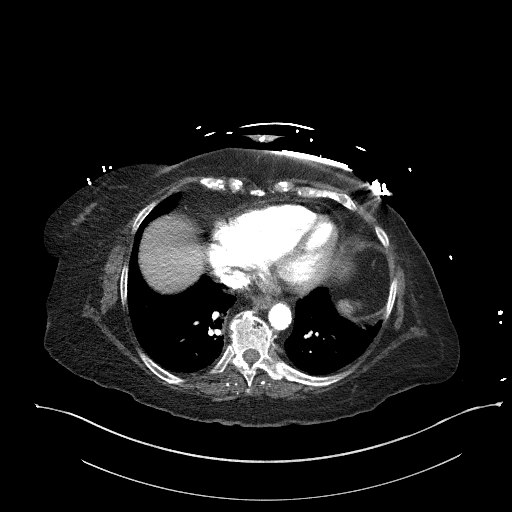
[im 145/203  bone]
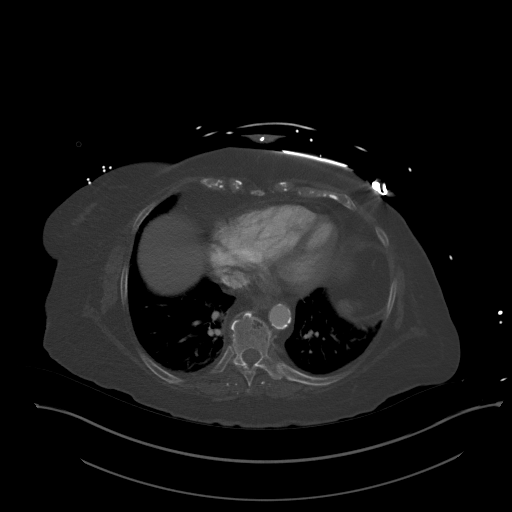
[im 159/203  soft-tissue]
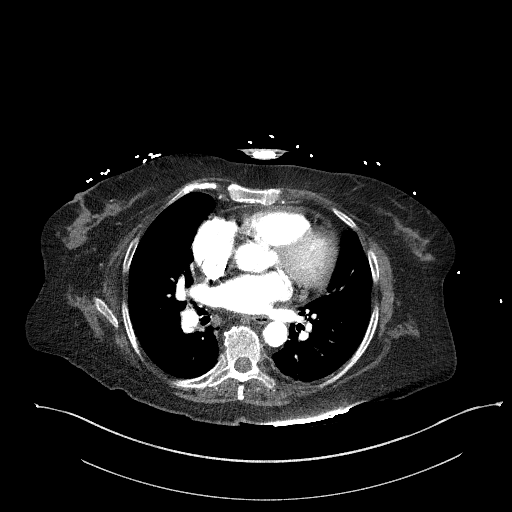
[im 174/203  soft-tissue]
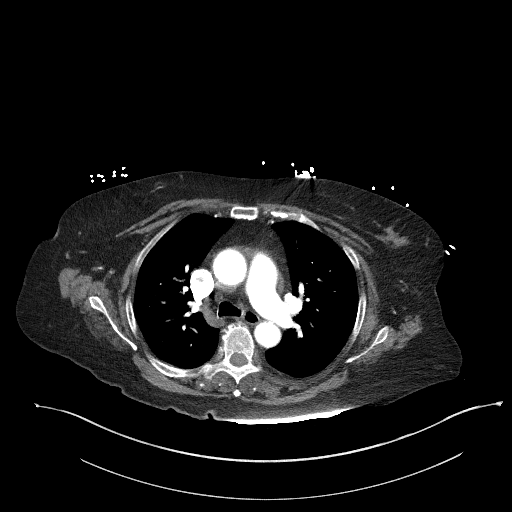
[im 188/203  soft-tissue]
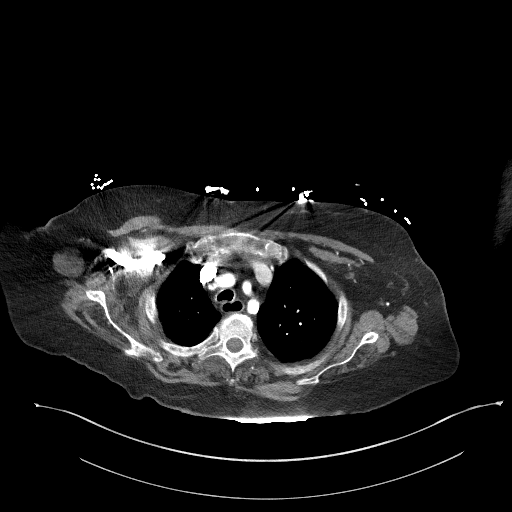

[Series 8: coronals · coronal · 0.95mm/px · 3 of 147 slices shown]
[im 37/147  soft-tissue]
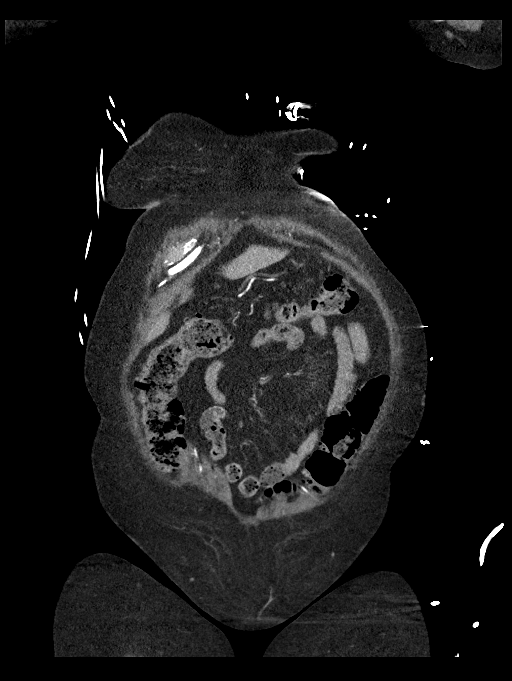
[im 74/147  soft-tissue]
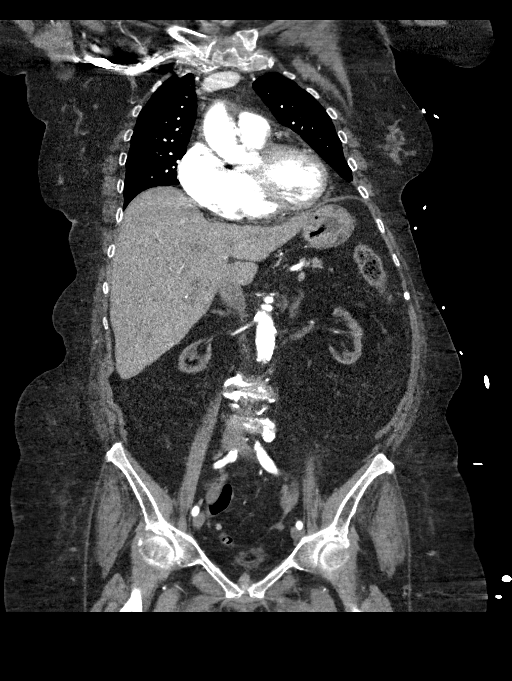
[im 110/147  soft-tissue]
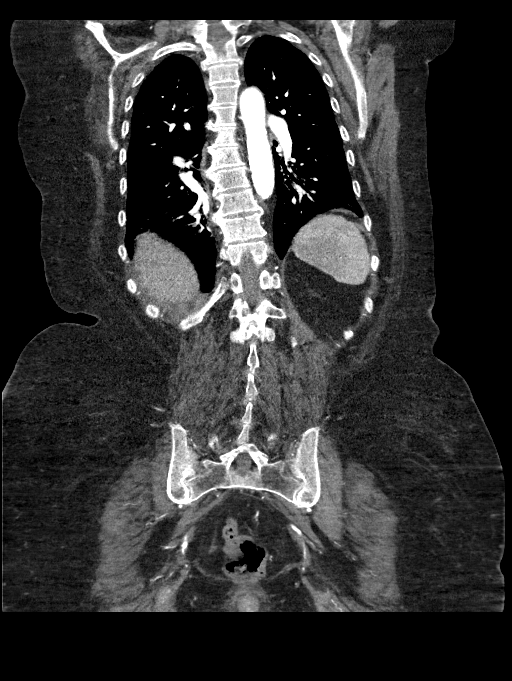

[15 of 46 positions shown; findings below may reference images not displayed]

FINDINGS: CTA CHEST FINDINGS

Cardiovascular: There is no aortic dissection or aneurysm. Calcific
aortic and coronary atherosclerosis is noted. There is mild
cardiomegaly. No pericardial effusion. Vascular stent in the left
subclavian vein is noted.

Mediastinum/Nodes: Coarse calcification and heterogeneous appearance
of the left lobe of the thyroid is chronic. No hiatal hernia. No
lymphadenopathy.

Lungs/Pleura: No pleural effusion. The lungs demonstrate mild
dependent atelectasis.

Musculoskeletal: No fracture or focal bony abnormality. Mildly
increased density most consistent with renal osteodystrophy noted.

Review of the MIP images confirms the above findings.

CTA ABDOMEN AND PELVIS FINDINGS

VASCULAR

Aorta: No aortic dissection or aneurysm. Extensive atherosclerosis
noted.

Celiac: High-grade stenosis, 80-90%, is seen just distal to the
takeoff. The vessel is well opacified with contrast distal to the
stenosis.

SMA: Atherosclerotic calcification at the takeoff without stenosis
noted.

Renals: Patent.

IMA: Widely patent.

Veins: Unremarkable.

Review of the MIP images confirms the above findings.

NON-VASCULAR

Hepatobiliary: Status post cholecystectomy. No focal liver lesion.
Pneumobilia is consistent with prior sphincterotomy is chronic and
unchanged since the prior chest CT.

Pancreas: Unremarkable. No pancreatic ductal dilatation or
surrounding inflammatory changes.

Spleen: Normal in size without focal abnormality.

Adrenals/Urinary Tract: Markedly atrophic kidneys consistent with
chronic renal failure noted. The kidneys are otherwise normal. The
urinary bladder is completely decompressed but otherwise
unremarkable.

Stomach/Bowel: Diverticulosis without diverticulitis is most notable
in the sigmoid. The colon is otherwise unremarkable. The stomach and
small bowel appear normal. There is no evidence of appendicitis.

Lymphatic: As described above.  Otherwise unremarkable.

Reproductive: Uterus and bilateral adnexa are unremarkable.

Other: Tiny fat containing supraumbilical hernia is noted. There is
some laxity of the anterior abdominal wall. No ascites.

Musculoskeletal: Bones demonstrate mildly increased density
throughout most consistent with renal osteodystrophy. There is
convex left lumbar scoliosis and spondylosis.

Review of the MIP images confirms the above findings.
IMPRESSION: No acute abnormality chest, abdomen or pelvis. Negative for aortic
dissection or aneurysm.

Short segment high-grade stenosis just distal to the takeoff of the
celiac axis.

Cardiomegaly.

Diverticulosis without diverticulitis.
